# Patient Record
Sex: Female | Born: 1975 | Race: Black or African American | Hispanic: Yes | Marital: Married | Smoking: Former smoker
Health system: Southern US, Community
[De-identification: ages and names within clinical notes are randomized; demographics above are authoritative.]

## PROBLEM LIST (undated history)

## (undated) DIAGNOSIS — R519 Headache, unspecified: Secondary | ICD-10-CM

## (undated) DIAGNOSIS — Z8619 Personal history of other infectious and parasitic diseases: Secondary | ICD-10-CM

## (undated) HISTORY — DX: Personal history of other infectious and parasitic diseases: Z86.19

---
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
## 2003-01-23 HISTORY — PX: CYST REMOVAL HAND: SHX6279

## 2005-05-14 ENCOUNTER — Emergency Department (HOSPITAL_COMMUNITY): Admission: EM | Admit: 2005-05-14 | Discharge: 2005-05-14 | Payer: Self-pay | Admitting: Emergency Medicine

## 2005-06-15 ENCOUNTER — Emergency Department (HOSPITAL_COMMUNITY): Admission: EM | Admit: 2005-06-15 | Discharge: 2005-06-15 | Payer: Self-pay | Admitting: Family Medicine

## 2007-12-09 ENCOUNTER — Emergency Department (HOSPITAL_COMMUNITY): Admission: EM | Admit: 2007-12-09 | Discharge: 2007-12-09 | Payer: Self-pay | Admitting: Family Medicine

## 2008-02-04 ENCOUNTER — Emergency Department (HOSPITAL_COMMUNITY): Admission: EM | Admit: 2008-02-04 | Discharge: 2008-02-04 | Payer: Self-pay | Admitting: Family Medicine

## 2008-06-23 ENCOUNTER — Emergency Department (HOSPITAL_COMMUNITY): Admission: EM | Admit: 2008-06-23 | Discharge: 2008-06-23 | Payer: Self-pay | Admitting: Family Medicine

## 2008-09-17 ENCOUNTER — Emergency Department (HOSPITAL_COMMUNITY): Admission: EM | Admit: 2008-09-17 | Discharge: 2008-09-17 | Payer: Self-pay | Admitting: Emergency Medicine

## 2010-05-01 LAB — POCT PREGNANCY, URINE: Preg Test, Ur: NEGATIVE

## 2010-05-01 LAB — POCT URINALYSIS DIP (DEVICE)
Bilirubin Urine: NEGATIVE
Ketones, ur: NEGATIVE mg/dL
Nitrite: POSITIVE — AB
pH: 5 (ref 5.0–8.0)

## 2010-05-01 LAB — URINE CULTURE

## 2010-05-08 LAB — URINE CULTURE: Colony Count: >=100000

## 2010-05-08 LAB — POCT URINALYSIS DIP (DEVICE)
Bilirubin Urine: NEGATIVE
Specific Gravity, Urine: 1.025 (ref 1.005–1.030)
pH: 6 (ref 5.0–8.0)

## 2010-10-24 LAB — HIV ANTIBODY (ROUTINE TESTING W REFLEX): HIV: NONREACTIVE

## 2010-10-24 LAB — GC/CHLAMYDIA PROBE AMP, GENITAL: GC Probe Amp, Genital: NEGATIVE

## 2016-02-17 ENCOUNTER — Ambulatory Visit (INDEPENDENT_AMBULATORY_CARE_PROVIDER_SITE_OTHER): Payer: BLUE CROSS/BLUE SHIELD | Admitting: Nurse Practitioner

## 2016-02-17 ENCOUNTER — Encounter: Payer: Self-pay | Admitting: Nurse Practitioner

## 2016-02-17 VITALS — BP 108/70 | HR 60 | Temp 98.2°F | Resp 14 | Ht 64.0 in | Wt 163.0 lb

## 2016-02-17 DIAGNOSIS — Z0000 Encounter for general adult medical examination without abnormal findings: Secondary | ICD-10-CM | POA: Diagnosis not present

## 2016-02-17 DIAGNOSIS — K219 Gastro-esophageal reflux disease without esophagitis: Secondary | ICD-10-CM | POA: Diagnosis not present

## 2016-02-17 MED ORDER — CIMETIDINE 200 MG PO TABS: 200.0000 mg | ORAL_TABLET | Freq: Two times a day (BID) | ORAL | Status: DC

## 2016-02-17 NOTE — Progress Notes (Signed)
Subjective:    Patient ID: Danielle Cannon, female    DOB: 06/03/1975, 41 y.o.   MRN: [id number]  Patient presents today for complete physical or establish care (new patient)   HPI   Throat Clearing: Ongoing for several years, worst with certain foods and morning. Tried cimetidine.  Immunizations: (TDAP, Hep C screen, Pneumovax, Influenza, zoster)  Health Maintenance  Topic Date Due  . Pap Smear  05/01/1998  . Flu Shot  04/21/2016*  . Tetanus Vaccine  01/22/2018  . HIV Screening  Completed  *Topic was postponed. The date shown is not the original due date.   Diet:regular Weight:  Wt Readings from Last 3 Encounters:  02/17/16 163 lb (73.9 kg)   Exercise:walking and running daily Fall Risk:none No flowsheet data found. Home Safety:home with children and boyfried Depression/Suicide:denies No flowsheet data found. No flowsheet data found. Pap Smear (every 4326yrs for >21-29 without HPV, every 8839yrs for >30-13639yrs with HPV):needed, upcoming appt with GYN Vision:up to date Dental:up to date Sexual History (birth control, marital status, STD):single, sxually active, hetrosexual, unprotected  Medications and allergies reviewed with patient and updated if appropriate.  There are no active problems to display for this patient.   No current outpatient prescriptions on file prior to visit.   No current facility-administered medications on file prior to visit.     Past Medical History:  Diagnosis Date  . History of chicken pox 1990    Past Surgical History:  Procedure Laterality Date  . CYST REMOVAL HAND Right 2005    Social History   Social History  . Marital status: Single    Spouse name: N/A  . Number of children: 2  . Years of education: some college   Occupational History  . [employer]   Social History Main Topics  . Smoking status: Former Smoker    Types: Cigarettes    Quit date: 02/17/2004  . Smokeless tobacco: Never Used  . Alcohol use 4.2 oz/week    7  Glasses of wine per week  . Drug use: No  . Sexual activity: Yes    Partners: Male    Birth control/ protection: None     Comment: single, unprotected   Other Topics Concern  . None   Social History Narrative  . None    Family History  Problem Relation Age of Onset  . Drug abuse Mother   . Diabetes Mother   . Ovarian cancer Mother 2468  . Alcoholism Father   . Cancer Maternal Grandmother     lymphoma  . Alzheimer's disease Maternal Grandfather   . Cancer Paternal Grandfather     throat        Review of Systems  Constitutional: Negative for fever, malaise/fatigue and weight loss.  HENT: Negative for congestion, sinus pain and sore throat.   Eyes:       Negative for visual changes  Respiratory: Positive for cough. Negative for sputum production, shortness of breath and wheezing.   Cardiovascular: Negative for chest pain, palpitations and leg swelling.  Gastrointestinal: Positive for heartburn. Negative for blood in stool, constipation and diarrhea.  Genitourinary: Negative for dysuria, frequency and urgency.  Musculoskeletal: Negative for falls, joint pain and myalgias.  Skin: Negative for rash.  Neurological: Negative for dizziness, sensory change and headaches.  Endo/Heme/Allergies: Does not bruise/bleed easily.  Psychiatric/Behavioral: Negative for depression, substance abuse and suicidal ideas. The patient is not nervous/anxious.     Objective:   Vitals:   02/17/16 1116  BP:  108/70  Pulse: 60  Resp: 14  Temp: 98.2 F (36.8 C)    Body mass index is 27.98 kg/m.   Physical Examination:  Physical Exam  Constitutional: She is oriented to person, place, and time and well-developed, well-nourished, and in no distress. No distress.  HENT:  Right Ear: External ear normal.  Left Ear: External ear normal.  Nose: Nose normal.  Mouth/Throat: Oropharynx is clear and moist. No oropharyngeal exudate.  Eyes: Conjunctivae and EOM are normal. Pupils are equal, round,  and reactive to light. No scleral icterus.  Neck: Normal range of motion. Neck supple. No thyromegaly present.  Cardiovascular: Normal rate, normal heart sounds and intact distal pulses.   Pulmonary/Chest: Effort normal and breath sounds normal. She exhibits no tenderness.  Abdominal: Soft. Bowel sounds are normal. She exhibits no distension. There is no tenderness.  Musculoskeletal: Normal range of motion. She exhibits no edema or tenderness.  Lymphadenopathy:    She has no cervical adenopathy.  Neurological: She is alert and oriented to person, place, and time. Gait normal.  Skin: Skin is warm and dry.  Psychiatric: Affect and judgment normal.    ASSESSMENT and PLAN:  Danielle Cannon was seen today for establish care.  Diagnoses and all orders for this visit:  Preventative health care -     TSH; Future -     CBC w/Diff; Future -     Lipid panel; Future -     Comprehensive metabolic panel; Future  Gastroesophageal reflux disease, esophagitis presence not specified -     cimetidine (TAGAMET) 200 MG tablet; Take 1 tablet (200 mg total) by mouth 2 (two) times daily.   No problem-specific Assessment & Plan notes found for this encounter.     Follow up: Return if symptoms worsen or fail to improve.  Danielle Penna, NP

## 2016-02-17 NOTE — Patient Instructions (Addendum)
Return to lab fasting at least 6-8hrs prior to blood draw. You will be called with results.  Have GYN office fax PAP smear results when done.  Food Choices for Gastroesophageal Reflux Disease, Adult When you have gastroesophageal reflux disease (GERD), the foods you eat and your eating habits are very important. Choosing the right foods can help ease the discomfort of GERD. What general guidelines do I need to follow?  Choose fruits, vegetables, whole grains, low-fat dairy products, and low-fat meat, fish, and poultry.  Limit fats such as oils, salad dressings, butter, nuts, and avocado.  Keep a food diary to identify foods that cause symptoms.  Avoid foods that cause reflux. These may be different for different people.  Eat frequent small meals instead of three large meals each day.  Eat your meals slowly, in a relaxed setting.  Limit fried foods.  Cook foods using methods other than frying.  Avoid drinking alcohol.  Avoid drinking large amounts of liquids with your meals.  Avoid bending over or lying down until 2-3 hours after eating. What foods are not recommended? The following are some foods and drinks that may worsen your symptoms: Vegetables  Tomatoes. Tomato juice. Tomato and spaghetti sauce. Chili peppers. Onion and garlic. Horseradish. Fruits  Oranges, grapefruit, and lemon (fruit and juice). Meats  High-fat meats, fish, and poultry. This includes hot dogs, ribs, ham, sausage, salami, and bacon. Dairy  Whole milk and chocolate milk. Sour cream. Cream. Butter. Ice cream. Cream cheese. Beverages  Coffee and tea, with or without caffeine. Carbonated beverages or energy drinks. Condiments  Hot sauce. Barbecue sauce. Sweets/Desserts  Chocolate and cocoa. Donuts. Peppermint and spearmint. Fats and Oils  High-fat foods, including [place] fries and potato chips. Other  Vinegar. Strong spices, such as black pepper, white pepper, red pepper, cayenne, curry powder,  cloves, ginger, and chili powder. The items listed above may not be a complete list of foods and beverages to avoid. Contact your dietitian for more information.  This information is not intended to replace advice given to you by your health care provider. Make sure you discuss any questions you have with your health care provider. Document Released: 01/08/2005 Document Revised: 06/16/2015 Document Reviewed: 11/12/2012 Elsevier Interactive Patient Education  2017 [employer]

## 2016-02-17 NOTE — Progress Notes (Signed)
Pre visit review using our clinic review tool, if applicable. No additional management support is needed unless otherwise documented below in the visit note. 

## 2016-02-20 ENCOUNTER — Other Ambulatory Visit (INDEPENDENT_AMBULATORY_CARE_PROVIDER_SITE_OTHER): Payer: BLUE CROSS/BLUE SHIELD

## 2016-02-20 DIAGNOSIS — Z0000 Encounter for general adult medical examination without abnormal findings: Secondary | ICD-10-CM | POA: Diagnosis not present

## 2016-02-20 LAB — CBC WITH DIFFERENTIAL/PLATELET
Basophils Absolute: 0 10*3/uL (ref 0.0–0.1)
Basophils Relative: 0.1 % (ref 0.0–3.0)
EOS PCT: 1 % (ref 0.0–5.0)
Eosinophils Absolute: 0.1 10*3/uL (ref 0.0–0.7)
HEMATOCRIT: 38.5 % (ref 36.0–46.0)
HEMOGLOBIN: 12.7 g/dL (ref 12.0–15.0)
LYMPHS ABS: 3 10*3/uL (ref 0.7–4.0)
LYMPHS PCT: 30.9 % (ref 12.0–46.0)
MCHC: 33.1 g/dL (ref 30.0–36.0)
MCV: 88.8 fl (ref 78.0–100.0)
MONOS PCT: 6.1 % (ref 3.0–12.0)
Monocytes Absolute: 0.6 10*3/uL (ref 0.1–1.0)
NEUTROS PCT: 61.9 % (ref 43.0–77.0)
Neutro Abs: 5.9 10*3/uL (ref 1.4–7.7)
Platelets: 337 10*3/uL (ref 150.0–400.0)
RBC: 4.34 Mil/uL (ref 3.87–5.11)
RDW: 14.2 % (ref 11.5–15.5)
WBC: 9.6 10*3/uL (ref 4.0–10.5)

## 2016-02-20 LAB — COMPREHENSIVE METABOLIC PANEL
ALT: 22 U/L (ref 0–35)
AST: 19 U/L (ref 0–37)
Albumin: 4.2 g/dL (ref 3.5–5.2)
Alkaline Phosphatase: 67 U/L (ref 39–117)
BUN: 15 mg/dL (ref 6–23)
CHLORIDE: 103 meq/L (ref 96–112)
CO2: 25 meq/L (ref 19–32)
Calcium: 9.1 mg/dL (ref 8.4–10.5)
Creatinine, Ser: 0.89 mg/dL (ref 0.40–1.20)
GFR: 75.12 mL/min (ref >60.00)
GLUCOSE: 99 mg/dL (ref 70–99)
POTASSIUM: 4.3 meq/L (ref 3.5–5.1)
SODIUM: 136 meq/L (ref 135–145)
Total Bilirubin: 0.4 mg/dL (ref 0.2–1.2)
Total Protein: 6.9 g/dL (ref 6.0–8.3)

## 2016-02-20 LAB — LIPID PANEL
CHOL/HDL RATIO: 3
Cholesterol: 161 mg/dL (ref 0–200)
HDL: 46.2 mg/dL (ref >39.00)
LDL CALC: 100 mg/dL — AB (ref 0–99)
NONHDL: 114.71
Triglycerides: 73 mg/dL (ref 0.0–149.0)
VLDL: 14.6 mg/dL (ref 0.0–40.0)

## 2016-02-20 LAB — TSH: TSH: 1.37 u[IU]/mL (ref 0.35–4.50)

## 2016-04-30 ENCOUNTER — Encounter (HOSPITAL_COMMUNITY): Payer: Self-pay | Admitting: *Deleted

## 2016-04-30 ENCOUNTER — Emergency Department (HOSPITAL_COMMUNITY)
Admission: EM | Admit: 2016-04-30 | Discharge: 2016-04-30 | Disposition: A | Discharge status: Home / Self Care | Payer: BLUE CROSS/BLUE SHIELD | Attending: Emergency Medicine | Admitting: Emergency Medicine

## 2016-04-30 DIAGNOSIS — Z87891 Personal history of nicotine dependence: Secondary | ICD-10-CM | POA: Insufficient documentation

## 2016-04-30 DIAGNOSIS — L309 Dermatitis, unspecified: Secondary | ICD-10-CM | POA: Diagnosis not present

## 2016-04-30 DIAGNOSIS — R21 Rash and other nonspecific skin eruption: Secondary | ICD-10-CM | POA: Diagnosis present

## 2016-04-30 LAB — RAPID STREP SCREEN (MED CTR MEBANE ONLY): Streptococcus, Group A Screen (Direct): NEGATIVE

## 2016-04-30 MED ORDER — PREDNISONE 10 MG PO TABS: ORAL_TABLET | ORAL | 0 refills | Status: DC

## 2016-04-30 NOTE — ED Triage Notes (Signed)
PT states on Friday started having funny sensation on hands and thinks she has eczema.  Rash.

## 2016-04-30 NOTE — ED Notes (Signed)
Pt states itchy red fine rash that started on wrist and has spread over her body to back neck and trunk , has NOT  used any benadryl but has used a friends cream that  She used on her baby

## 2016-04-30 NOTE — ED Provider Notes (Signed)
[facility]-EMERGENCY DEPT Provider Note   CSN: [id number] Arrival date & time: 04/30/16  6213  By signing my name below, I, [name], attest that this documentation has been prepared under the direction and in the presence of non-physician practitioner, Ok Edwards, PA-C. Electronically Signed: [name], Scribe. 04/30/2016. 10:24 AM.  History   Chief Complaint Chief Complaint  Patient presents with  . Rash   The history is provided by the patient. No language interpreter was used.   HPI Comments: Danielle Cannon is a 41 y.o. female who presents to the Emergency Department complaining of a gradually worsening, generalized pruritic rash that began ~3 days ago. Pt reports hx of similar symptoms "years ago" on her bilateral hands that "turned into eczema." She states she has been using topical cream that her friend gave her with no relief. She notes she experienced associated nausea, vomiting, and subjective fever 2 days ago that has now resolved; however, her daughter at home is still experiencing similar symptoms. Pt denies any sore throat.    Past Medical History:  Diagnosis Date  . History of chicken pox 1990   There are no active problems to display for this patient.  Past Surgical History:  Procedure Laterality Date  . CYST REMOVAL HAND Right 2005    OB History    No data available     Home Medications    Prior to Admission medications   Medication Sig Start Date End Date Taking? Authorizing Provider  cimetidine (TAGAMET) 200 MG tablet Take 1 tablet (200 mg total) by mouth 2 (two) times daily. 02/17/16   Anne Ng, NP  glucosamine-chondroitin 500-400 MG tablet Take 1 tablet by mouth 3 (three) times daily.    Historical Provider, MD  Multiple Vitamin (MULTIVITAMIN) tablet Take 1 tablet by mouth daily.    Historical Provider, MD    Family History Family History  Problem Relation Age of Onset  . Drug abuse Mother   . Diabetes Mother   . Ovarian cancer Mother 31  .  Alcoholism Father   . Cancer Maternal Grandmother     lymphoma  . Alzheimer's disease Maternal Grandfather   . Cancer Paternal Grandfather     throat    Social History Social History  Substance Use Topics  . Smoking status: Former Smoker    Types: Cigarettes    Quit date: 02/17/2004  . Smokeless tobacco: Never Used  . Alcohol use 4.2 oz/week    7 Glasses of wine per week     Comment: daily   Allergies   Patient has no known allergies.  Review of Systems Review of Systems  Constitutional: Positive for fever (resolved).  HENT: Negative for sore throat.   Gastrointestinal: Positive for nausea (resolved) and vomiting (resolved).  Skin: Positive for rash.   Physical Exam Updated Vital Signs BP 113/73 (BP Location: Left Arm)   Pulse 66   Temp 97.9 F (36.6 C)   Resp 16   LMP 04/15/2016 (Exact Date)   SpO2 100%   Physical Exam  Constitutional: She is oriented to person, place, and time. She appears well-developed and well-nourished.  HENT:  Head: Normocephalic.  Eyes: EOM are normal.  Neck: Normal range of motion.  Pulmonary/Chest: Effort normal.  Abdominal: She exhibits no distension.  Musculoskeletal: Normal range of motion.  Neurological: She is alert and oriented to person, place, and time.  Skin: Rash noted.  Fine, raised, mildly erythematous rash to her full body.  Psychiatric: She has a normal mood  and affect.  Nursing note and vitals reviewed.  ED Treatments / Results  DIAGNOSTIC STUDIES:  Oxygen Saturation is 100% on RA, normal by my interpretation.    COORDINATION OF CARE:  10:12 AM Discussed treatment plan with pt at bedside and pt agreed to plan.  Labs (all labs ordered are listed, but only abnormal results are displayed) Labs Reviewed  RAPID STREP SCREEN (NOT AT  [facility])  CULTURE, GROUP A STREP [facility])   EKG  EKG Interpretation None      Radiology No results found.  Procedures Procedures (including critical care time)  Medications  Ordered in ED Medications - No data to display  Initial Impression / Assessment and Plan / ED Course  I have reviewed the triage vital signs and the nursing notes.  Pertinent labs & imaging results that were available during my care of the patient were reviewed by me and considered in my medical decision making (see chart for details).       Final Clinical Impressions(s) / ED Diagnoses   Final diagnoses:  Rash and nonspecific skin eruption  Eczema, unspecified type  Rash    New Prescriptions Discharge Medication List as of 04/30/2016 11:34 AM    START taking these medications   Details  predniSONE (DELTASONE) 10 MG tablet 6,5,4,3,2,1 taper, Print      An After Visit Summary was printed and given to the patient. Meds ordered this encounter  Medications  . predniSONE (DELTASONE) 10 MG tablet    Sig: 6,5,4,3,2,1 taper    Dispense:  21 tablet    Refill:  0    Order Specific Question:   Supervising Provider    Answer:   Eber Hong [[id number]]    I personally performed the services in this documentation, which was scribed in my presence.  The recorded information has been reviewed and considered.   Barnet Pall.    Lonia Skinner [place], PA-C 04/30/16 1549    Pricilla Loveless, MD 05/04/16 [phone]

## 2016-04-30 NOTE — Discharge Instructions (Signed)
See your Physician for recheck.  Benadryl for itching.

## 2016-05-02 LAB — CULTURE, GROUP A STREP (THRC)

## 2017-12-11 ENCOUNTER — Emergency Department (HOSPITAL_COMMUNITY): Payer: 59

## 2017-12-11 ENCOUNTER — Other Ambulatory Visit: Payer: Self-pay

## 2017-12-11 ENCOUNTER — Encounter (HOSPITAL_COMMUNITY): Payer: Self-pay | Admitting: Emergency Medicine

## 2017-12-11 ENCOUNTER — Emergency Department (HOSPITAL_COMMUNITY)
Admission: EM | Admit: 2017-12-11 | Discharge: 2017-12-11 | Disposition: A | Discharge status: Home / Self Care | Payer: 59 | Attending: Emergency Medicine | Admitting: Emergency Medicine

## 2017-12-11 DIAGNOSIS — Z79899 Other long term (current) drug therapy: Secondary | ICD-10-CM | POA: Diagnosis not present

## 2017-12-11 DIAGNOSIS — Z87891 Personal history of nicotine dependence: Secondary | ICD-10-CM | POA: Diagnosis not present

## 2017-12-11 DIAGNOSIS — M542 Cervicalgia: Secondary | ICD-10-CM | POA: Diagnosis present

## 2017-12-11 DIAGNOSIS — M62838 Other muscle spasm: Secondary | ICD-10-CM | POA: Diagnosis not present

## 2017-12-11 MED ORDER — KETOROLAC TROMETHAMINE 30 MG/ML IJ SOLN
30.0000 mg | Freq: Once | INTRAMUSCULAR | Status: AC
  Administered 2017-12-11: 30 mg via INTRAMUSCULAR
  Filled 2017-12-11: qty 1

## 2017-12-11 MED ORDER — METHOCARBAMOL 500 MG PO TABS: 500.0000 mg | ORAL_TABLET | Freq: Two times a day (BID) | ORAL | 0 refills | Status: AC

## 2017-12-11 MED ORDER — METHOCARBAMOL 500 MG PO TABS
500.0000 mg | ORAL_TABLET | Freq: Once | ORAL | Status: AC
  Administered 2017-12-11: 500 mg via ORAL
  Filled 2017-12-11: qty 1

## 2017-12-11 NOTE — ED Provider Notes (Signed)
[facility] EMERGENCY DEPARTMENT Provider Note   CSN: [id number] Arrival date & time: 12/11/17  1548     History   Chief Complaint Chief Complaint  Patient presents with  . Neck Pain    HPI Danielle Cannon is a 42 y.o. female.  42 y.o female with no PMH presents to the ED with a chief complaint of neck pain x 1 days. Patient reports her symptoms first began yesterday along with left shoulder pain, which she describes as tingling. She reports the pain is worse with movement and while laying down flat. She has tried ice, ibuprofen but states no relieve in symptoms. Patient also reports some chest pain which she describes its due to "clearing her throat with a cough". She denies any previous history of CAD, fever, photophobia, or shortness of breath.      Past Medical History:  Diagnosis Date  . History of chicken pox 1990    There are no active problems to display for this patient.   Past Surgical History:  Procedure Laterality Date  . CYST REMOVAL HAND Right 2005     OB History   None      Home Medications    Prior to Admission medications   Medication Sig Start Date End Date Taking? Authorizing Provider  cimetidine (TAGAMET) 200 MG tablet Take 1 tablet (200 mg total) by mouth 2 (two) times daily. 02/17/16   Nche, Bonna Gains, NP  glucosamine-chondroitin 500-400 MG tablet Take 1 tablet by mouth 3 (three) times daily.    [provider]  methocarbamol (ROBAXIN) 500 MG tablet Take 1 tablet (500 mg total) by mouth 2 (two) times daily for 7 days. 12/11/17 12/18/17  Claude Manges, PA-C  Multiple Vitamin (MULTIVITAMIN) tablet Take 1 tablet by mouth daily.    [provider]  predniSONE (DELTASONE) 10 MG tablet 6,5,4,3,2,1 taper 04/30/16   Elson Areas, PA-C    Family History Family History  Problem Relation Age of Onset  . Drug abuse Mother   . Diabetes Mother   . Ovarian cancer Mother 16  . Alcoholism Father   . Cancer Maternal  Grandmother        lymphoma  . Alzheimer's disease Maternal Grandfather   . Cancer Paternal Grandfather        throat    Social History Social History   Tobacco Use  . Smoking status: Former Smoker    Types: Cigarettes    Last attempt to quit: 02/17/2004    Years since quitting: 13.8  . Smokeless tobacco: Never Used  Substance Use Topics  . Alcohol use: Yes    Alcohol/week: 7.0 standard drinks    Types: 7 Glasses of wine per week    Comment: daily  . Drug use: No     Allergies   Patient has no known allergies.   Review of Systems Review of Systems  Constitutional: Negative for fever.  Eyes: Negative for photophobia.  Musculoskeletal: Positive for neck pain and neck stiffness.     Physical Exam Updated Vital Signs BP 115/80   Pulse 85   Temp 98.5 F (36.9 C) (Oral)   Resp 18   SpO2 99%   Physical Exam  Constitutional: She is oriented to person, place, and time. She appears well-developed and well-nourished. No distress.  HENT:  Head: Normocephalic and atraumatic.  Mouth/Throat: Oropharynx is clear and moist. No oropharyngeal exudate.  Eyes: Pupils are equal, round, and reactive to light.  Neck: Muscular tenderness present. No spinous  process tenderness present. No neck rigidity. Decreased range of motion present. No edema and no erythema present. No Brudzinski's sign and no Kernig's sign noted.  Cardiovascular: Regular rhythm and normal heart sounds.  Pulmonary/Chest: Effort normal and breath sounds normal. No respiratory distress.  Abdominal: Soft. Bowel sounds are normal. She exhibits no distension. There is no tenderness.  Musculoskeletal: She exhibits no tenderness or deformity.       Cervical back: She exhibits pain and spasm.       Back:       Right lower leg: She exhibits no edema.       Left lower leg: She exhibits no edema.  Neurological: She is alert and oriented to person, place, and time.  Skin: Skin is warm and dry.  Psychiatric: She has a  normal mood and affect.  Nursing note and vitals reviewed.    ED Treatments / Results  Labs (all labs ordered are listed, but only abnormal results are displayed) Labs Reviewed - No data to display  EKG None  Radiology Dg Cervical Spine 2-3 Views  Result Date: 12/11/2017 CLINICAL DATA:  42 year old female with cervical spine pain, left shoulder pain and lower back pain. No known injury. EXAM: CERVICAL SPINE - 2-3 VIEW COMPARISON:  None. FINDINGS: Reversal of the normal cervical lordosis. No evidence of acute fracture or malalignment. No significant focal degenerative change or bony lesion. The visualized upper lungs are clear. No focal soft tissue abnormality. IMPRESSION: Reversal of the normal cervical lordosis may be secondary to underlying muscle spasm. No evidence of fracture, focal degenerative disease or bony lesion. Electronically Signed   By: Malachy MoanHeath  McCullough M.D.   On: 12/11/2017 16:56    Procedures Procedures (including critical care time)  Medications Ordered in ED Medications  methocarbamol (ROBAXIN) tablet 500 mg (500 mg Oral Given 12/11/17 1625)     Initial Impression / Assessment and Plan / ED Course  I have reviewed the triage vital signs and the nursing notes.  Pertinent labs & imaging results that were available during my care of the patient were reviewed by me and considered in my medical decision making (see chart for details).   Presents with neck pain along with muscle spasms which began yesterday.  Patient has tried ibuprofen along with ice but states no relieving symptoms.  She has pain with movement of her neck but denies any neck rigidity or fevers.  Low suspicion for meningitis patient is afebrile denies any photophobia, headache.  DG c spine  Reversal of the normal cervical lordosis may be secondary to  underlying muscle spasm.  I have given patient Robaxin while in the ED.  We will also give her a shot of Toradol while she is in the ED.  Patient  understands and agrees with management.  Patient does not have a primary care physician to follow-up with but states she will look for 1 to follow-up with.  Precautions provided.    Final Clinical Impressions(s) / ED Diagnoses   Final diagnoses:  Muscle spasms of neck    ED Discharge Orders         Ordered    methocarbamol (ROBAXIN) 500 MG tablet  2 times daily     12/11/17 1701           Claude MangesSoto, Devonda Pequignot, PA-C 12/11/17 1731    Virgina NorfolkCuratolo, Adam, DO 12/11/17 [id number]

## 2017-12-11 NOTE — ED Notes (Signed)
Patient given discharge instructions and verbalized understanding.  Patient stable to discharge at this time.  Patient is alert and oriented to baseline.  No distressed noted at this time.  All belongings taken with the patient at discharge.   

## 2017-12-11 NOTE — Discharge Instructions (Addendum)
I have prescribed muscle relaxers for your pain, please do not drink or drive while taking this medications as they can make you drowsy.    Please follow-up with PCP in 1 week for reevaluation of your symptoms.   

## 2017-12-11 NOTE — ED Triage Notes (Signed)
Pt reports waking up yesterday with a stiff neck. Pt reports today she felt pain shooting up to her head, L shoulder and lower back. Pt 800 mg ibuprofen 2 hours ago with some relief. Pt also reports using ice with some relief.

## 2018-02-27 ENCOUNTER — Ambulatory Visit: Payer: 59 | Attending: Family Medicine | Admitting: Family Medicine

## 2018-02-27 ENCOUNTER — Encounter: Payer: Self-pay | Admitting: Family Medicine

## 2018-02-27 VITALS — BP 117/73 | HR 61 | Temp 98.5°F | Resp 18 | Ht 65.0 in | Wt 163.0 lb

## 2018-02-27 DIAGNOSIS — F5102 Adjustment insomnia: Secondary | ICD-10-CM

## 2018-02-27 DIAGNOSIS — Z862 Personal history of diseases of the blood and blood-forming organs and certain disorders involving the immune mechanism: Secondary | ICD-10-CM

## 2018-02-27 DIAGNOSIS — R5383 Other fatigue: Secondary | ICD-10-CM | POA: Diagnosis not present

## 2018-02-27 DIAGNOSIS — F4321 Adjustment disorder with depressed mood: Secondary | ICD-10-CM | POA: Diagnosis not present

## 2018-02-27 DIAGNOSIS — F4320 Adjustment disorder, unspecified: Secondary | ICD-10-CM

## 2018-02-27 MED ORDER — TRAZODONE HCL 50 MG PO TABS: 25.0000 mg | ORAL_TABLET | Freq: Every evening | ORAL | 3 refills | Status: DC | PRN

## 2018-02-27 NOTE — Patient Instructions (Signed)

## 2018-02-27 NOTE — Progress Notes (Signed)
Subjective:    Patient ID: Danielle Cannon, female    DOB: May 30, 1975, 43 y.o.   MRN: [id number]  HPI       43 yo female new to the practice.  Patient reports that she is having difficulty sleeping as well as some fatigue and sadness after the recent death of her mother.  Patient states that she was at the nursing home with her mother and family around Christmas time and then patient returned home as her mother was in a coma and unresponsive.  Patient states that she knew that her mother would pass away but she was still not prepared for her mother's passing.  Patient states that she has difficulty both falling asleep and staying asleep.  Patient states that she will fall asleep for a few hours and then wake up and not be able to fall back asleep.  Patient has tried melatonin and over-the-counter sleep aids without success.  Patient does not believe that she is depressed but she is having grief issues after her mother's passing.  Patient states that her mother was diagnosed in 2017 with ovarian cancer which returned in 2019.  Patient reports no significant personal medical history other than prior anemia.  Patient reports that she is divorced but currently in a committed relationship.  Patient reports she is a former smoker and quit 18 to 19 years ago.  Patient does drink wine about 3-4 times per week.  Patient reports that her only surgical history has been removal of a cyst from her left hand.  Patient has no known allergies to drugs, foods or insect stings.   Past Medical History:  Diagnosis Date  . History of chicken pox 1990   Past Surgical History:  Procedure Laterality Date  . CYST REMOVAL HAND Right 2005   Social History   Tobacco Use  . Smoking status: Former Smoker    Types: Cigarettes    Last attempt to quit: 02/17/2004    Years since quitting: 14.0  . Smokeless tobacco: Never Used  Substance Use Topics  . Alcohol use: Yes    Alcohol/week: 7.0 standard drinks    Types: 7 Glasses of  wine per week    Comment: daily  . Drug use: No   Family History  Problem Relation Age of Onset  . Drug abuse Mother   . Diabetes Mother   . Ovarian cancer Mother 71  . Cancer Mother   . Alcoholism Father   . Hypertension Father   . Cancer Maternal Grandmother        lymphoma  . Alzheimer's disease Maternal Grandfather   . Cancer Paternal Grandfather        throat      Review of Systems  Constitutional: Positive for fatigue. Negative for chills and fever.  HENT: Negative for sore throat and trouble swallowing.   Respiratory: Negative for cough and shortness of breath.   Cardiovascular: Negative for chest pain, palpitations and leg swelling.  Gastrointestinal: Negative for abdominal pain, constipation, diarrhea and nausea.  Endocrine: Negative for polydipsia, polyphagia and polyuria.  Genitourinary: Negative for dysuria, flank pain and frequency.  Musculoskeletal: Negative for arthralgias, back pain and gait problem.  Allergic/Immunologic: Negative for environmental allergies, food allergies and immunocompromised state.  Neurological: Negative for dizziness and headaches.  Hematological: Negative for adenopathy. Does not bruise/bleed easily.  Psychiatric/Behavioral: Positive for sleep disturbance. Negative for self-injury and suicidal ideas. The patient is nervous/anxious.        Objective:   Physical Exam  BP 117/73 (BP Location: Right Arm, Patient Position: Sitting, Cuff Size: Normal)   Pulse 61   Temp 98.5 F (36.9 C) (Oral)   Resp 18   Ht 5\' 5"  (1.651 m)   Wt 163 lb (73.9 kg)   LMP 02/09/2018   SpO2 99%   BMI 27.12 kg/m Nurse's notes and vital signs reviewed General-well-nourished, well-developed adult female in no acute distress Neck-supple, no lymphadenopathy, no palpable thyroid nodules, borderline thyroid size Cardiovascular-regular rate and regular rhythm Lungs-clear to auscultation bilaterally, breathing is non-labored Abdomen-soft, nontender Back-no CVA  tenderness Extremities-no edema Psych- normal mood and judgment though affect is slightly flattened versus patient being fatigued        Assessment & Plan:  1. Adjustment insomnia Patient with grief reaction and adjustment insomnia.  Prescription provided for trazodone 50 mg to take one half or 1 pill at bedtime as needed for sleep.  Sleep hygiene also discussed - traZODone (DESYREL) 50 MG tablet; Take 0.5-1 tablets (25-50 mg total) by mouth at bedtime as needed for sleep.  Dispense: 30 tablet; Refill: 3  2. Fatigue, unspecified type Patient with complaint of fatigue which is likely related to her poor sleep and grief reaction status post the recent death of her mother.  Patient will have TSH, CBC and BMP at today's visit to look for thyroid disorder, anemia as patient reports prior history of anemia and electrolyte abnormality which may be contributing to her fatigue.  Patient was provided with medication to help with sleep and hopefully fatigue will improve once patient has reestablished a normal sleep pattern. - TSH - CBC with Differential - Basic Metabolic Panel  3.  Grief reaction Patient encouraged to obtain counseling through local counseling service, church bereavement group or hospice.  Patient declined meeting social worker at today's visit.  An After Visit Summary was printed and given to the patient.  Return if symptoms worsen or fail to improve, for consider well exam.

## 2018-02-28 LAB — BASIC METABOLIC PANEL WITH GFR
BUN/Creatinine Ratio: 25 — ABNORMAL HIGH (ref 9–23)
BUN: 20 mg/dL (ref 6–24)
CO2: 20 mmol/L (ref 20–29)
Calcium: 9.9 mg/dL (ref 8.7–10.2)
Chloride: 102 mmol/L (ref 96–106)
Creatinine, Ser: 0.8 mg/dL (ref 0.57–1.00)
GFR calc Af Amer: 107 mL/min/1.73
GFR calc non Af Amer: 93 mL/min/1.73
Glucose: 77 mg/dL (ref 65–99)
Potassium: 4.7 mmol/L (ref 3.5–5.2)
Sodium: 137 mmol/L (ref 134–144)

## 2018-02-28 LAB — CBC WITH DIFFERENTIAL/PLATELET
Basophils Absolute: 0 x10E3/uL (ref 0.0–0.2)
Basos: 0 %
EOS (ABSOLUTE): 0.1 x10E3/uL (ref 0.0–0.4)
Eos: 1 %
Hematocrit: 38.8 % (ref 34.0–46.6)
Hemoglobin: 12.9 g/dL (ref 11.1–15.9)
Immature Grans (Abs): 0 x10E3/uL (ref 0.0–0.1)
Immature Granulocytes: 0 %
Lymphocytes Absolute: 3.3 x10E3/uL — ABNORMAL HIGH (ref 0.7–3.1)
Lymphs: 29 %
MCH: 29.3 pg (ref 26.6–33.0)
MCHC: 33.2 g/dL (ref 31.5–35.7)
MCV: 88 fL (ref 79–97)
Monocytes Absolute: 0.7 x10E3/uL (ref 0.1–0.9)
Monocytes: 6 %
Neutrophils Absolute: 7.2 x10E3/uL — ABNORMAL HIGH (ref 1.4–7.0)
Neutrophils: 64 %
Platelets: 358 x10E3/uL (ref 150–450)
RBC: 4.4 x10E6/uL (ref 3.77–5.28)
RDW: 13.1 % (ref 11.7–15.4)
WBC: 11.4 x10E3/uL — ABNORMAL HIGH (ref 3.4–10.8)

## 2018-02-28 LAB — TSH: TSH: 1 u[IU]/mL (ref 0.450–4.500)

## 2018-03-01 DIAGNOSIS — Z862 Personal history of diseases of the blood and blood-forming organs and certain disorders involving the immune mechanism: Secondary | ICD-10-CM | POA: Insufficient documentation

## 2018-03-03 ENCOUNTER — Telehealth: Payer: Self-pay | Admitting: *Deleted

## 2018-03-03 NOTE — Telephone Encounter (Signed)
Patient verified DOB Patient is aware of labs being normal and needing to monitor any cold symptoms being that her WBC was elevated slightly. Patient states daughter is sick and she will mind her own symptoms but denies feeling bad. No further questions at this time

## 2018-03-03 NOTE — Telephone Encounter (Signed)
-----   Message from Cain Saupe, MD sent at 03/01/2018  8:25 PM EST ----- Notify patient of normal BMP, normal TSH and a slight increase in wbc at 11.4 (normal 3.4-10.8)

## 2018-03-05 ENCOUNTER — Other Ambulatory Visit: Payer: Self-pay | Admitting: Family Medicine

## 2018-03-05 ENCOUNTER — Telehealth: Payer: Self-pay | Admitting: Nurse Practitioner

## 2018-03-05 DIAGNOSIS — Z1239 Encounter for other screening for malignant neoplasm of breast: Secondary | ICD-10-CM

## 2018-03-05 NOTE — Telephone Encounter (Signed)
Patient wants a call back about a mammogram. There is no order for one.

## 2018-03-05 NOTE — Telephone Encounter (Signed)
Patient is requesting the order for mammogram be placed.

## 2018-03-05 NOTE — Telephone Encounter (Signed)
Order has been placed for mammogram at the [facility]

## 2018-03-05 NOTE — Progress Notes (Signed)
Patient ID: Danielle Cannon, female   DOB: October 24, 1975, 43 y.o.   MRN: [id number]   Patient called requesting mammogram order. Order placed for mammogram at the breast center. At last visit, I believe that patient was listed as uninsured and received information to apply for scholarship for free mammogram but is now listed as having coverage by [employer].

## 2018-03-13 ENCOUNTER — Other Ambulatory Visit: Payer: 59 | Admitting: Family Medicine

## 2018-03-20 ENCOUNTER — Ambulatory Visit
Admission: RE | Admit: 2018-03-20 | Discharge: 2018-03-20 | Disposition: A | Discharge status: Home / Self Care | Payer: 59 | Source: Ambulatory Visit | Attending: Family Medicine | Admitting: Family Medicine

## 2018-03-20 DIAGNOSIS — Z1239 Encounter for other screening for malignant neoplasm of breast: Secondary | ICD-10-CM

## 2018-03-21 ENCOUNTER — Other Ambulatory Visit: Payer: Self-pay | Admitting: Family Medicine

## 2018-03-21 DIAGNOSIS — R928 Other abnormal and inconclusive findings on diagnostic imaging of breast: Secondary | ICD-10-CM

## 2018-03-23 ENCOUNTER — Other Ambulatory Visit: Payer: Self-pay | Admitting: Family Medicine

## 2018-03-23 DIAGNOSIS — N6310 Unspecified lump in the right breast, unspecified quadrant: Secondary | ICD-10-CM

## 2018-03-23 DIAGNOSIS — R928 Other abnormal and inconclusive findings on diagnostic imaging of breast: Secondary | ICD-10-CM

## 2018-03-23 NOTE — Progress Notes (Signed)
Patient ID: Danielle Cannon, female   DOB: [birth year], 43 y.o.   MRN: [id number]   Patient with a possible mass in the right breast seen on recent mammogram and patient per radiology needs diagnostic mammogram and ultrasound of the right breast

## 2018-03-26 ENCOUNTER — Ambulatory Visit
Admission: RE | Admit: 2018-03-26 | Discharge: 2018-03-26 | Disposition: A | Discharge status: Home / Self Care | Payer: 59 | Source: Ambulatory Visit | Attending: Family Medicine | Admitting: Family Medicine

## 2018-03-26 DIAGNOSIS — R928 Other abnormal and inconclusive findings on diagnostic imaging of breast: Secondary | ICD-10-CM

## 2018-04-08 ENCOUNTER — Ambulatory Visit (HOSPITAL_COMMUNITY)
Admission: EM | Admit: 2018-04-08 | Discharge: 2018-04-08 | Disposition: A | Discharge status: Home / Self Care | Payer: 59 | Attending: Family Medicine | Admitting: Family Medicine

## 2018-04-08 ENCOUNTER — Other Ambulatory Visit: Payer: Self-pay

## 2018-04-08 ENCOUNTER — Encounter (HOSPITAL_COMMUNITY): Payer: Self-pay | Admitting: Emergency Medicine

## 2018-04-08 DIAGNOSIS — J019 Acute sinusitis, unspecified: Secondary | ICD-10-CM

## 2018-04-08 MED ORDER — AMOXICILLIN-POT CLAVULANATE 875-125 MG PO TABS: 1.0000 | ORAL_TABLET | Freq: Two times a day (BID) | ORAL | 0 refills | Status: AC

## 2018-04-08 NOTE — ED Triage Notes (Signed)
Pt here for nasal congestion and URI sx

## 2018-04-08 NOTE — ED Provider Notes (Signed)
[facility]    CSN: [id number] Arrival date & time: 04/08/18  1059     History   Chief Complaint Chief Complaint  Patient presents with  . Nasal Congestion    HPI Danielle Cannon is a 43 y.o. female no significant past medical history presenting today for evaluation of congestion and sore throat.  Patient states that she has had symptoms for approximately 1 to 2 weeks.  Of recently feel her symptoms have worsened.  She has had a lot of pressure in her face and eyes.  She is also started to have discomfort in her teeth as well as decreased taste.  She denies any cough, denies shortness of breath, denies fever.  She has had some mild body aches.  Patient notes that she did recently travel to [place].  She has been taking cetirizine/pseudoephedrine combo as well as Flonase with minimal relief.  HPI  Past Medical History:  Diagnosis Date  . History of chicken pox 1990    Patient Active Problem List   Diagnosis Date Noted  . History of anemia 03/01/2018    Past Surgical History:  Procedure Laterality Date  . CYST REMOVAL HAND Right 2005    OB History   No obstetric history on file.      Home Medications    Prior to Admission medications   Medication Sig Start Date End Date Taking? Authorizing Provider  amoxicillin-clavulanate (AUGMENTIN) 875-125 MG tablet Take 1 tablet by mouth every 12 (twelve) hours for 10 days. 04/08/18 04/18/18  Terriann Difonzo C, PA-C  glucosamine-chondroitin 500-400 MG tablet Take 1 tablet by mouth 3 (three) times daily.    [provider]  Multiple Vitamin (MULTIVITAMIN) tablet Take 1 tablet by mouth daily.    [provider]  traZODone (DESYREL) 50 MG tablet Take 0.5-1 tablets (25-50 mg total) by mouth at bedtime as needed for sleep. 02/27/18   Cain Saupe, MD    Family History Family History  Problem Relation Age of Onset  . Drug abuse Mother   . Diabetes Mother   . Ovarian cancer Mother 3  . Cancer Mother   .  Alcoholism Father   . Hypertension Father   . Cancer Maternal Grandmother        lymphoma  . Alzheimer's disease Maternal Grandfather   . Cancer Paternal Grandfather        throat    Social History Social History   Tobacco Use  . Smoking status: Former Smoker    Types: Cigarettes    Last attempt to quit: 02/17/2004    Years since quitting: 14.1  . Smokeless tobacco: Never Used  Substance Use Topics  . Alcohol use: Yes    Alcohol/week: 7.0 standard drinks    Types: 7 Glasses of wine per week    Comment: daily  . Drug use: No     Allergies   Patient has no known allergies.   Review of Systems Review of Systems  Constitutional: Negative for activity change, appetite change, chills, fatigue and fever.  HENT: Positive for congestion, rhinorrhea, sinus pressure and sore throat. Negative for ear pain and trouble swallowing.   Eyes: Negative for discharge and redness.  Respiratory: Negative for cough, chest tightness and shortness of breath.   Cardiovascular: Negative for chest pain.  Gastrointestinal: Negative for abdominal pain, diarrhea, nausea and vomiting.  Musculoskeletal: Negative for myalgias.  Skin: Negative for rash.  Neurological: Negative for dizziness, light-headedness and headaches.     Physical Exam Triage Vital  Signs ED Triage Vitals [04/08/18 1214]  Enc Vitals Group     BP 115/76     Pulse Rate 62     Resp 18     Temp 97.6 F (36.4 C)     Temp Source Temporal     SpO2 99 %     Weight      Height      Head Circumference      Peak Flow      Pain Score 3     Pain Loc      Pain Edu?      Excl. in GC?    No data found.  Updated Vital Signs BP 115/76 (BP Location: Right Arm)   Pulse 62   Temp 97.6 F (36.4 C) (Temporal)   Resp 18   SpO2 99%   Visual Acuity Right Eye Distance:   Left Eye Distance:   Bilateral Distance:    Right Eye Near:   Left Eye Near:    Bilateral Near:     Physical Exam Vitals signs and nursing note reviewed.   Constitutional:      General: She is not in acute distress.    Appearance: She is well-developed.  HENT:     Head: Normocephalic and atraumatic.     Ears:     Comments: Bilateral ears without tenderness to palpation of external auricle, tragus and mastoid, EAC's without erythema or swelling, TM's with good bony landmarks and cone of light. Non erythematous.     Nose:     Comments: Nasal mucosa erythematous, mildly swollen turbinates    Mouth/Throat:     Comments: Oral mucosa pink and moist, no tonsillar enlargement or exudate. Posterior pharynx patent and nonerythematous, no uvula deviation or swelling. Normal phonation.  Eyes:     Conjunctiva/sclera: Conjunctivae normal.  Neck:     Musculoskeletal: Neck supple.  Cardiovascular:     Rate and Rhythm: Normal rate and regular rhythm.     Heart sounds: No murmur.  Pulmonary:     Effort: Pulmonary effort is normal. No respiratory distress.     Breath sounds: Normal breath sounds.     Comments: Breathing comfortably at rest, CTABL, no wheezing, rales or other adventitious sounds auscultated Abdominal:     Palpations: Abdomen is soft.     Tenderness: There is no abdominal tenderness.  Skin:    General: Skin is warm and dry.  Neurological:     Mental Status: She is alert.      UC Treatments / Results  Labs (all labs ordered are listed, but only abnormal results are displayed) Labs Reviewed - No data to display  EKG None  Radiology No results found.  Procedures Procedures (including critical care time)  Medications Ordered in UC Medications - No data to display  Initial Impression / Assessment and Plan / UC Course  I have reviewed the triage vital signs and the nursing notes.  Pertinent labs & imaging results that were available during my care of the patient were reviewed by me and considered in my medical decision making (see chart for details).     We will treat patient for sinusitis, will begin on Augmentin,  continue symptomatic management with cetirizine/Sudafed combo, push fluids.  Continue to monitor,Discussed strict return precautions. Patient verbalized understanding and is agreeable with plan.   Patient had recently traveled to [facility] which is an area with higher concentration of novel coronavirus, denies lower respiratory symptoms of fever, shortness of breath and  cough.  Will defer testing at this time and continue treatment for sinusitis. Final Clinical Impressions(s) / UC Diagnoses   Final diagnoses:  Acute sinusitis with symptoms > 10 days     Discharge Instructions     Begin Augmentin twice daily Continue cetirizine/sudafed combo medicine Push fluids Follow up if symptoms not resolving or worsening   ED Prescriptions    Medication Sig Dispense Auth. Provider   amoxicillin-clavulanate (AUGMENTIN) 875-125 MG tablet Take 1 tablet by mouth every 12 (twelve) hours for 10 days. 20 tablet Antionne Enrique, [place] C, PA-C     Controlled Substance Prescriptions [place] Controlled Substance Registry consulted? Not Applicable   Lew Dawes, [place] 04/08/18 1555

## 2018-04-08 NOTE — Discharge Instructions (Addendum)
Begin Augmentin twice daily Continue cetirizine/sudafed combo medicine Push fluids Follow up if symptoms not resolving or worsening

## 2018-04-21 ENCOUNTER — Other Ambulatory Visit: Payer: 59 | Admitting: Family Medicine

## 2018-05-16 ENCOUNTER — Other Ambulatory Visit: Payer: Self-pay

## 2018-05-16 ENCOUNTER — Encounter: Payer: Self-pay | Admitting: Family Medicine

## 2018-05-16 ENCOUNTER — Ambulatory Visit (HOSPITAL_BASED_OUTPATIENT_CLINIC_OR_DEPARTMENT_OTHER): Payer: 59 | Admitting: Family Medicine

## 2018-05-16 ENCOUNTER — Other Ambulatory Visit (HOSPITAL_COMMUNITY)
Admission: RE | Admit: 2018-05-16 | Discharge: 2018-05-16 | Disposition: A | Discharge status: Home / Self Care | Payer: 59 | Source: Ambulatory Visit | Attending: Family Medicine | Admitting: Family Medicine

## 2018-05-16 VITALS — BP 124/78 | HR 59 | Temp 98.8°F | Ht 65.0 in | Wt 157.4 lb

## 2018-05-16 DIAGNOSIS — N979 Female infertility, unspecified: Secondary | ICD-10-CM | POA: Diagnosis not present

## 2018-05-16 DIAGNOSIS — N898 Other specified noninflammatory disorders of vagina: Secondary | ICD-10-CM

## 2018-05-16 DIAGNOSIS — Z124 Encounter for screening for malignant neoplasm of cervix: Secondary | ICD-10-CM | POA: Diagnosis not present

## 2018-05-16 DIAGNOSIS — N841 Polyp of cervix uteri: Secondary | ICD-10-CM | POA: Diagnosis not present

## 2018-05-16 NOTE — Progress Notes (Signed)
Per pt she is here to get second opinion,   She said her mother passed away this January 202 with Ovarian Cancer  Per pt she went to this OBGYN and was told that she had some cyst and they was normal and common and this was 2.5 years ago.   Per patient she had a benign cyst under her right breast but she's been feeling pain.  Per patient sometimes it's painful during intercourse.   Went to [place] for family reasons in March

## 2018-05-16 NOTE — Patient Instructions (Signed)
Preventive Care 40-64 Years, Female Preventive care refers to lifestyle choices and visits with your health care provider that can promote health and wellness. What does preventive care include?   A yearly physical exam. This is also called an annual well check.  Dental exams once or twice a year.  Routine eye exams. Ask your health care provider how often you should have your eyes checked.  Personal lifestyle choices, including: ? Daily care of your teeth and gums. ? Regular physical activity. ? Eating a healthy diet. ? Avoiding tobacco and drug use. ? Limiting alcohol use. ? Practicing safe sex. ? Taking low-dose aspirin daily starting at age 50. ? Taking vitamin and mineral supplements as recommended by your health care provider. What happens during an annual well check? The services and screenings done by your health care provider during your annual well check will depend on your age, overall health, lifestyle risk factors, and family history of disease. Counseling Your health care provider may ask you questions about your:  Alcohol use.  Tobacco use.  Drug use.  Emotional well-being.  Home and relationship well-being.  Sexual activity.  Eating habits.  Work and work environment.  Method of birth control.  Menstrual cycle.  Pregnancy history. Screening You may have the following tests or measurements:  Height, weight, and BMI.  Blood pressure.  Lipid and cholesterol levels. These may be checked every 5 years, or more frequently if you are over 50 years old.  Skin check.  Lung cancer screening. You may have this screening every year starting at age 55 if you have a 30-pack-year history of smoking and currently smoke or have quit within the past 15 years.  Colorectal cancer screening. All adults should have this screening starting at age 50 and continuing until age 75. Your health care provider may recommend screening at age 45. You will have tests every  1-10 years, depending on your results and the type of screening test. People at increased risk should start screening at an earlier age. Screening tests may include: ? Guaiac-based fecal occult blood testing. ? Fecal immunochemical test (FIT). ? Stool DNA test. ? Virtual colonoscopy. ? Sigmoidoscopy. During this test, a flexible tube with a tiny camera (sigmoidoscope) is used to examine your rectum and lower colon. The sigmoidoscope is inserted through your anus into your rectum and lower colon. ? Colonoscopy. During this test, a long, thin, flexible tube with a tiny camera (colonoscope) is used to examine your entire colon and rectum.  Hepatitis C blood test.  Hepatitis B blood test.  Sexually transmitted disease (STD) testing.  Diabetes screening. This is done by checking your blood sugar (glucose) after you have not eaten for a while (fasting). You may have this done every 1-3 years.  Mammogram. This may be done every 1-2 years. Talk to your health care provider about when you should start having regular mammograms. This may depend on whether you have a family history of breast cancer.  BRCA-related cancer screening. This may be done if you have a family history of breast, ovarian, tubal, or peritoneal cancers.  Pelvic exam and Pap test. This may be done every 3 years starting at age 21. Starting at age 30, this may be done every 5 years if you have a Pap test in combination with an HPV test.  Bone density scan. This is done to screen for osteoporosis. You may have this scan if you are at high risk for osteoporosis. Discuss your test results, treatment options,   and if necessary, the need for more tests with your health care provider. Vaccines Your health care provider may recommend certain vaccines, such as:  Influenza vaccine. This is recommended every year.  Tetanus, diphtheria, and acellular pertussis (Tdap, Td) vaccine. You may need a Td booster every 10 years.  Varicella  vaccine. You may need this if you have not been vaccinated.  Zoster vaccine. You may need this after age 53.  Measles, mumps, and rubella (MMR) vaccine. You may need at least one dose of MMR if you were born in 1957 or later. You may also need a second dose.  Pneumococcal 13-valent conjugate (PCV13) vaccine. You may need this if you have certain conditions and were not previously vaccinated.  Pneumococcal polysaccharide (PPSV23) vaccine. You may need one or two doses if you smoke cigarettes or if you have certain conditions.  Meningococcal vaccine. You may need this if you have certain conditions.  Hepatitis A vaccine. You may need this if you have certain conditions or if you travel or work in places where you may be exposed to hepatitis A.  Hepatitis B vaccine. You may need this if you have certain conditions or if you travel or work in places where you may be exposed to hepatitis B.  Haemophilus influenzae type b (Hib) vaccine. You may need this if you have certain conditions. Talk to your health care provider about which screenings and vaccines you need and how often you need them. This information is not intended to replace advice given to you by your health care provider. Make sure you discuss any questions you have with your health care provider. Document Released: 02/04/2015 Document Revised: 02/28/2017 Document Reviewed: 11/09/2014 Elsevier Interactive Patient Education  2019 [employer].  Female Infertility  Female infertility refers to a woman's inability to get pregnant (conceive) after a year of having sex regularly (or after 6 months in women over age 108) without using birth control. Infertility can also mean that a woman is not able to carry a pregnancy to full term. Both women and men can have fertility problems. What are the causes? This condition may be caused by:  Problems with reproductive organs. Infertility can result if a woman: ? Has an abnormally short cervix  or a cervix that does not remain closed during a pregnancy. ? Has a blockage or scarring in the fallopian tubes. ? Has an abnormally shaped uterus. ? Has uterine fibroids. This is a benign mass of tissue or muscle (tumor) that can develop in the uterus. ? Is not ovulating in a regular way.  Certain medical conditions. These may include: ? Polycystic ovary syndrome (PCOS). This is a hormonal disorder that can cause small cysts to grow on the ovaries. This is the most common cause of infertility in women. ? Endometriosis. This is a condition in which the tissue that lines the uterus (endometrium) grows outside of its normal location. ? Cancer and cancer treatments, such as chemotherapy or radiation. ? Premature ovarian failure. This is when ovaries stop producing eggs and hormones before age 1. ? Sexually transmitted diseases, such as chlamydia or gonorrhea. ? Autoimmune disorders. These are disorders in which the body's defense system (immune system) attacks normal, healthy cells. Infertility can be linked to more than one cause. For some women, the cause of infertility is not known (unexplained infertility). What increases the risk?  Age. A woman's fertility declines with age, especially after her mid-32s.  Being underweight or overweight.  Drinking too much alcohol.  Using drugs such as anabolic steroids, cocaine, and marijuana.  Exercising excessively.  Being exposed to environmental toxins, such as radiation, pesticides, and certain chemicals. What are the signs or symptoms? The main sign of infertility in women is the inability to get pregnant or carry a pregnancy to full term. How is this diagnosed? This condition may be diagnosed by:  Checking whether you are ovulating each month. The tests may include: ? Blood tests to check hormone levels. ? An ultrasound of the ovaries. ? Taking a small tissue that lines the uterus and checking it under a microscope (endometrial  biopsy).  Doing additional tests. This is done if ovulation is normal. Tests may include: ? Hysterosalpingography. This X-ray test can show the shape of the uterus and whether the fallopian tubes are open. ? Laparoscopy. This test uses a lighted tube (laparoscope) to look for problems in the fallopian tubes and other organs. ? Transvaginal ultrasound. This imaging test is used to check for abnormalities in the uterus and ovaries. ? Hysteroscopy. This test uses a lighted tube to check for problems in the cervix and the uterus. To be diagnosed with infertility, both partners will have a physical exam. Both partners will also have an extensive medical and sexual history taken. Additional tests may be done. How is this treated? Treatment depends on the cause of infertility. Most cases of infertility in women are treated with medicine or surgery.  Women may take medicine to: ? Correct ovulation problems. ? Treat other health conditions.  Surgery may be done to: ? Repair damage to the ovaries, fallopian tubes, cervix, or uterus. ? Remove growths from the uterus. ? Remove scar tissue from the uterus, pelvis, or other organs. Assisted reproductive technology (ART) Assisted reproductive technology (ART) refers to all treatments and procedures that combine eggs and sperm outside the body to try to help a couple conceive. ART is often combined with fertility drugs to stimulate ovulation. Sometimes ART is done using eggs retrieved from another woman's body (donor eggs) or from previously frozen fertilized eggs (embryos). There are different types of ART. These include:  Intrauterine insemination (IUI). A long, thin tube is used to place sperm directly into a woman's uterus. This procedure: ? Is effective for infertility caused by sperm problems, including low sperm count and low motility. ? Can be used in combination with fertility drugs.  In vitro fertilization (IVF). This is done when a woman's  fallopian tubes are blocked or when a man has low sperm count. In this procedure: ? Fertility drugs are used to stimulate the ovaries to produce multiple eggs. ? Once mature, these eggs are removed from the body and combined with the sperm to be fertilized. ? The fertilized eggs are then placed into the woman's uterus. Follow these instructions at home:  Take over-the-counter and prescription medicines only as told by your health care provider.  Do not use any products that contain nicotine or tobacco, such as cigarettes and e-cigarettes. If you need help quitting, ask your health care provider.  If you drink alcohol, limit how much you have to 1 drink a day.  Make dietary changes to lose weight or maintain a healthy weight. Work with your health care provider and a dietitian to set a weight-loss goal that is healthy and reasonable for you.  Seek support from a counselor or support group to talk about your concerns related to infertility. Couples counseling may be helpful for you and your partner.  Practice stress reduction techniques that  work well for you, such as regular physical activity, meditation, or deep breathing.  Keep all follow-up visits as told by your health care provider. This is important. Contact a health care provider if you:  Feel that stress is interfering with your life and relationships.  Have side effects from treatments for infertility. Summary  Female infertility refers to a woman's inability to get pregnant (conceive) after a year of having sex regularly (or after 6 months in women over age 37) without using birth control.  To be diagnosed with infertility, both partners will have a physical exam. Both partners will also have an extensive medical and sexual history taken.  Seek support from a counselor or support group to talk about your concerns related to infertility. Couples counseling may be helpful for you and your partner. This information is not  intended to replace advice given to you by your health care provider. Make sure you discuss any questions you have with your health care provider. Document Released: 01/11/2003 Document Revised: 12/10/2016 Document Reviewed: 12/10/2016 Elsevier Interactive Patient Education  2019 [employer].

## 2018-05-16 NOTE — Progress Notes (Signed)
Established Patient Office Visit  Subjective:  Patient ID: Danielle Cannon, female    DOB: 09/06/1975  Age: 43 y.o. MRN: [id number]  CC:  Chief Complaint  Patient presents with  . Pap Only  . Breast Pain  . intercourse pain    HPI Danielle Cannon presents for screening for cervical cancer/pap smear. She reports that her periods are regular and she is not on any form of birth control. Patient is concerned about infertility because in the past she has been able to get pregnant easily but despite unprotected sexual intercourse she has not gotten pregnant. She also also started having pain during sexual intercourse and crampy lower abdominal pain in the middle of her cycle and has started using a phone app which suggests that her pain is greatest during ovulation. She does not recall any abnormal mammograms in the past.       She did have a mammogram and had to return for additional views as there was an abnormality seen in the right breast which turned out to be a benign cyst but she continues to have pain on that side/area of her right breast. She denies caffeine intake.   Past Medical History:  Diagnosis Date  . History of chicken pox 1990    Past Surgical History:  Procedure Laterality Date  . CYST REMOVAL HAND Right 2005    Family History  Problem Relation Age of Onset  . Drug abuse Mother   . Diabetes Mother   . Ovarian cancer Mother [id number]  . Cancer Mother   . Alcoholism Father   . Hypertension Father   . Cancer Maternal Grandmother        lymphoma  . Alzheimer's disease Maternal Grandfather   . Cancer Paternal Grandfather        throat   Social History   Tobacco Use  . Smoking status: Former Smoker    Types: Cigarettes    Last attempt to quit: 02/17/2004    Years since quitting: 14.2  . Smokeless tobacco: Never Used  Substance Use Topics  . Alcohol use: Yes    Alcohol/week: 7.0 standard drinks    Types: 7 Glasses of wine per week    Comment: daily  . Drug use: No      Outpatient Medications Prior to Visit  Medication Sig Dispense Refill  . Ascorbic Acid (VITAMIN C PO) Take by mouth.    . ELDERBERRY PO Take by mouth.    . FOLIC ACID PO Take by mouth.    . Ginger, Zingiber officinalis, (GINGER PO) Take by mouth.    Marland Kitchen. glucosamine-chondroitin 500-400 MG tablet Take 1 tablet by mouth 3 (three) times daily.    . traZODone (DESYREL) 50 MG tablet Take 0.5-1 tablets (25-50 mg total) by mouth at bedtime as needed for sleep. 30 tablet 3  . VITAMIN E PO Take by mouth.    . Multiple Vitamin (MULTIVITAMIN) tablet Take 1 tablet by mouth daily.     No facility-administered medications prior to visit.     No Known Allergies  ROS Review of Systems  Constitutional: Negative for chills, fatigue and fever.  HENT: Negative for sore throat and trouble swallowing.   Eyes: Negative for photophobia and visual disturbance.  Respiratory: Negative for cough and shortness of breath.   Cardiovascular: Negative for chest pain, palpitations and leg swelling.  Gastrointestinal: Positive for abdominal pain (related to menstral cycle). Negative for blood in stool, constipation, diarrhea, nausea and vomiting.  Endocrine: Negative for polydipsia, polyphagia  and polyuria.  Genitourinary: Positive for dyspareunia and menstrual problem (pain during ovulation). Negative for dysuria, flank pain and frequency.  Musculoskeletal: Negative for back pain, gait problem and joint swelling.  Allergic/Immunologic: Negative for environmental allergies, food allergies and immunocompromised state.  Neurological: Negative for dizziness and headaches.  Hematological: Negative for adenopathy. Does not bruise/bleed easily.  Psychiatric/Behavioral: Negative for sleep disturbance. The patient is not nervous/anxious.       Objective:    Physical Exam  Constitutional: She is oriented to person, place, and time. She appears well-developed and well-nourished.  Cardiovascular: Normal rate and regular  rhythm.  Pulmonary/Chest: Effort normal and breath sounds normal.  Abdominal: Soft. There is no abdominal tenderness. There is no rebound and no guarding.  Genitourinary:    Uterus normal.     Vaginal discharge (white vaginal discharge in the vaginal canal, vault and on the surface of the  cervix) present.     Genitourinary Comments: Possible polyp versus irritation at the cervical os with some friability/bleeding; no CMT, no adnexal fullness or tenderness   Musculoskeletal:        General: No tenderness or edema.  Neurological: She is alert and oriented to person, place, and time.  Skin: Skin is warm and dry.  Psychiatric: She has a normal mood and affect. Her behavior is normal. Judgment and thought content normal.  Nursing note and vitals reviewed.   BP 124/78 (BP Location: Right Arm, Patient Position: Sitting, Cuff Size: Normal)   Pulse (!) 59   Temp 98.8 F (37.1 C) (Oral)   Ht 5\' 5"  (1.651 m)   Wt 157 lb 6.4 oz (71.4 kg)   LMP 04/30/2018 (Exact Date)   SpO2 97%   BMI 26.19 kg/m  Wt Readings from Last 3 Encounters:  05/16/18 157 lb 6.4 oz (71.4 kg)  02/27/18 163 lb (73.9 kg)  02/17/16 163 lb (73.9 kg)     Health Maintenance Due  Topic Date Due  . PAP SMEAR-Modifier  05/01/1998  . TETANUS/TDAP  01/22/2018   -Pap done at today's visit -Tdap offered but declined by patient    Lab Results  Component Value Date   TSH 1.000 02/27/2018   Lab Results  Component Value Date   WBC 11.4 (H) 02/27/2018   HGB 12.9 02/27/2018   HCT 38.8 02/27/2018   MCV 88 02/27/2018   PLT 358 02/27/2018   Lab Results  Component Value Date   NA 137 02/27/2018   K 4.7 02/27/2018   CO2 20 02/27/2018   GLUCOSE 77 02/27/2018   BUN 20 02/27/2018   CREATININE 0.80 02/27/2018   BILITOT 0.4 02/20/2016   ALKPHOS 67 02/20/2016   AST 19 02/20/2016   ALT 22 02/20/2016   PROT 6.9 02/20/2016   ALBUMIN 4.2 02/20/2016   CALCIUM 9.9 02/27/2018   GFR 75.12 02/20/2016   Lab Results   Component Value Date   CHOL 161 02/20/2016   Lab Results  Component Value Date   HDL 46.20 02/20/2016   Lab Results  Component Value Date   LDLCALC 100 (H) 02/20/2016   Lab Results  Component Value Date   TRIG 73.0 02/20/2016   Lab Results  Component Value Date   CHOLHDL 3 02/20/2016   No results found for: HGBA1C    Assessment & Plan:  1. Screening for cervical cancer Pap done at today's visit and patient will be notified of results and if further treatment needed. Handout on preventative care for women aged 68 and higher provided as  part of After Visit Summary - Cytology - PAP  2. Cervical polyp Patient with possible small cervical polyp vs. Irritation at the cervical os for which patient will be referred to GYN for further evaluation and treatment - Ambulatory referral to Obstetrics / Gynecology  3. Female fertility problem Referral to OB/GYN for evaluation of fertility - Ambulatory referral to Obstetrics / Gynecology  4. Vaginal discharge Patient with presence of vaginal discharge on exam and patient requests testing for sexually transmitted infections or presence of BV/candida. She will be notified of the results and if further treatment is needed based on the results - Cervicovaginal ancillary only  -suggested use of otc supplement Evening Primrose to see if this helps with breast discomfort due to the presence of a breast cyst  An After Visit Summary was printed and given to the patient.  Follow-up: Return if symptoms worsen or fail to improve, for yearly well exam and as needed.   Cain Saupe, MD

## 2018-05-19 ENCOUNTER — Other Ambulatory Visit: Payer: Self-pay | Admitting: Family Medicine

## 2018-05-19 DIAGNOSIS — B9689 Other specified bacterial agents as the cause of diseases classified elsewhere: Secondary | ICD-10-CM

## 2018-05-19 DIAGNOSIS — N760 Acute vaginitis: Principal | ICD-10-CM

## 2018-05-19 LAB — CERVICOVAGINAL ANCILLARY ONLY
Bacterial vaginitis: POSITIVE — AB
Candida vaginitis: NEGATIVE
Chlamydia: NEGATIVE
Neisseria Gonorrhea: NEGATIVE
Trichomonas: NEGATIVE

## 2018-05-19 MED ORDER — METRONIDAZOLE 500 MG PO TABS: 500.0000 mg | ORAL_TABLET | Freq: Two times a day (BID) | ORAL | 0 refills | Status: DC

## 2018-05-19 NOTE — Progress Notes (Signed)
Patient ID: Danielle Cannon, female   DOB: 03/03/1975, 43 y.o.   MRN: [id number]   Patient with evidence of bacterial vaginitis on ancillary cytology. Patient will be notified of the results and RX will be sent in for metronidazole 500 mg twice daily for 7 days

## 2018-05-23 LAB — CYTOLOGY - PAP
Diagnosis: NEGATIVE
HPV: NOT DETECTED

## 2018-05-24 ENCOUNTER — Encounter: Payer: Self-pay | Admitting: Family Medicine

## 2018-05-28 ENCOUNTER — Ambulatory Visit: Payer: Self-pay | Admitting: Obstetrics & Gynecology

## 2018-05-29 ENCOUNTER — Other Ambulatory Visit: Payer: Self-pay | Admitting: Family Medicine

## 2018-05-29 ENCOUNTER — Encounter: Payer: Self-pay | Admitting: Family Medicine

## 2018-05-29 DIAGNOSIS — R430 Anosmia: Secondary | ICD-10-CM

## 2018-05-29 NOTE — Telephone Encounter (Signed)
Patient mychart concern.

## 2018-05-29 NOTE — Progress Notes (Signed)
Patient ID: Danielle Cannon, female   DOB: 08-25-1975, 43 y.o.   MRN: [id number]   Patient left my chart message regarding the loss of sensation of smell and taste since early March.  Patient states that she forgot to mention this at her recent well visit but would like a referral for further evaluation.  ENT referral will be placed

## 2018-06-10 ENCOUNTER — Other Ambulatory Visit: Payer: Self-pay | Admitting: Family Medicine

## 2018-06-10 ENCOUNTER — Encounter: Payer: Self-pay | Admitting: Family Medicine

## 2018-06-10 DIAGNOSIS — R430 Anosmia: Secondary | ICD-10-CM

## 2018-06-10 NOTE — Telephone Encounter (Signed)
[facility] referral request

## 2018-06-10 NOTE — Progress Notes (Signed)
Patient ID: Danielle Cannon, female   DOB: June 24, 1975, 43 y.o.   MRN: [id number]   My chart message received from patient that she would like to now proceed with ENT referral due to lack of sense of smell and taste which is been ongoing.  No COVID-19 symptoms.

## 2018-06-18 ENCOUNTER — Encounter: Payer: Self-pay | Admitting: Family Medicine

## 2018-06-19 ENCOUNTER — Telehealth: Payer: Self-pay | Admitting: Family Medicine

## 2018-06-19 NOTE — Telephone Encounter (Signed)
Per quest of the nurse I called Pt and informed her that her [employer] it show active at this time she dopes not need the financial application they sent her by mistake

## 2018-07-07 ENCOUNTER — Other Ambulatory Visit: Payer: Self-pay

## 2018-07-08 ENCOUNTER — Encounter: Payer: Self-pay | Admitting: Obstetrics & Gynecology

## 2018-07-08 ENCOUNTER — Ambulatory Visit: Payer: 59 | Admitting: Obstetrics & Gynecology

## 2018-07-08 ENCOUNTER — Encounter: Payer: Self-pay | Admitting: *Deleted

## 2018-07-08 VITALS — BP 120/70 | Ht 66.0 in | Wt 161.0 lb

## 2018-07-08 DIAGNOSIS — R350 Frequency of micturition: Secondary | ICD-10-CM | POA: Diagnosis not present

## 2018-07-08 DIAGNOSIS — N979 Female infertility, unspecified: Secondary | ICD-10-CM | POA: Diagnosis not present

## 2018-07-08 DIAGNOSIS — Z8041 Family history of malignant neoplasm of ovary: Secondary | ICD-10-CM

## 2018-07-08 DIAGNOSIS — N841 Polyp of cervix uteri: Secondary | ICD-10-CM

## 2018-07-08 MED ORDER — CIPROFLOXACIN HCL 500 MG PO TABS: 500.0000 mg | ORAL_TABLET | Freq: Two times a day (BID) | ORAL | 0 refills | Status: AC

## 2018-07-08 NOTE — Progress Notes (Signed)
    Danielle Cannon January 14, 1976 [id number]        43 y.o.  [id number] Stable boyfriend x 6 yrs  RP: Possible cervical polyp vs irritation per exam with Fam MD on 05/16/2018  HPI: Menses regular normal every month. No BTB.  No pelvic pain.  No pain with IC.  No postcoital bleeding.  Not on any contraception, would like to conceive.  BV on wet prep, treated with Metronidazole. Pap test negative 04/2018.   OB History  Gravida Para Term Preterm AB Living  4 2     2 2   SAB TAB Ectopic Multiple Live Births               # Outcome Date GA Lbr Len/2nd Weight Sex Delivery Anes PTL Lv  4 AB           3 AB           2 Para           1 Para             Past medical history,surgical history, problem list, medications, allergies, family history and social history were all reviewed and documented in the EPIC chart.   Directed ROS with pertinent positives and negatives documented in the history of present illness/assessment and plan.  Exam:  Vitals:   07/08/18 1419  BP: 120/70  Weight: 161 lb (73 kg)  Height: 5\' 6"  (1.676 m)   General appearance:  Normal  Abdomen: Normal  Gynecologic exam: Vulva normal.  Speculum:  Cervix normal, no lesion, no erythema, no polyp seen.  Vagina normal. Normal vaginal secretions.  Bimanual exam:  Uterus AV, normal volume, mobile, NT.  No adnexal mass felt, NT bilaterally.  U/A: Dark yellow cloudy, protein negative, nitrite negative, white blood cells 20-40, red blood cells 3-10, moderate bacteria.  Pending urine culture.   Assessment/Plan:  43 y.o. [id number]   1. Cervical polyp No cervical polyp on gyn exam today, cervix normal.  Patient reassured.  Recent Pap test negative.  Vaginal secretions normal post treatment of BV with Metronidazole. - US Transvaginal Non-OB; Future  2. Urinary frequency U/A abnormal, probable Acute Cystitis.  Pending U. Culture.  Decision to start treatment now with Cipro 500 mg BID x 5 days.  Usage reviewed and prescription sent to  pharmacy. - Urinalysis,Complete w/RFL Culture  3. Family history of ovarian cancer Mother with advanced Ovarian Cancer at 53 yo, deceased a few yrs later.  Per patient, mother's genetic testing was negative.  Will screen patient with a Pelvic US at f/u.  Benefit of BCPs to decrease the risk of ovarian cancer discussed with patient.  Declines contraception at this time. 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  Declines investigation and treatment at this time.  Other orders - ciprofloxacin (CIPRO) 500 MG tablet; Take 1 tablet (500 mg total) by mouth 2 (two) times daily for 5 days.  Counseling on above issues and coordination of care >50% x 45 minutes.  Princess Bruins MD, 2:27 PM 07/08/2018

## 2018-07-09 ENCOUNTER — Encounter: Payer: Self-pay | Admitting: Obstetrics & Gynecology

## 2018-07-09 NOTE — Patient Instructions (Signed)
1. Cervical polyp No cervical polyp on gyn exam today, cervix normal.  Patient reassured.  Recent Pap test negative.  Vaginal secretions normal post treatment of BV with Metronidazole. - US Transvaginal Non-OB; Future  2. Urinary frequency U/A abnormal, probable Acute Cystitis.  Pending U. Culture.  Decision to start treatment now with Cipro 500 mg BID x 5 days.  Usage reviewed and prescription sent to pharmacy. - Urinalysis,Complete w/RFL Culture  3. Family history of ovarian cancer Mother with advanced Ovarian Cancer at 22 yo, deceased a few yrs later.  Per patient, mother's genetic testing was negative.  Will screen patient with a Pelvic US at f/u.  Benefit of BCPs to decrease the risk of ovarian cancer discussed with patient.  Declines contraception at this time. 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  Declines investigation and treatment at this time.  Other orders - ciprofloxacin (CIPRO) 500 MG tablet; Take 1 tablet (500 mg total) by mouth 2 (two) times daily for 5 days.  Corabelle, it was a pleasure meeting you today!  I will inform you of your results as soon as they are available.

## 2018-07-10 LAB — URINALYSIS, COMPLETE W/RFL CULTURE
Bilirubin Urine: NEGATIVE
Glucose, UA: NEGATIVE
Hyaline Cast: NONE SEEN /LPF
Ketones, ur: NEGATIVE
Nitrites, Initial: NEGATIVE
Protein, ur: NEGATIVE
Specific Gravity, Urine: 1.02 (ref 1.001–1.03)
pH: 5.5 (ref 5.0–8.0)

## 2018-07-10 LAB — URINE CULTURE
MICRO NUMBER:: 578888
SPECIMEN QUALITY:: ADEQUATE

## 2018-07-10 LAB — CULTURE INDICATED

## 2018-08-18 ENCOUNTER — Ambulatory Visit: Payer: 59 | Admitting: Obstetrics & Gynecology

## 2018-08-18 ENCOUNTER — Other Ambulatory Visit: Payer: 59

## 2018-08-20 ENCOUNTER — Other Ambulatory Visit: Payer: Self-pay

## 2018-08-21 ENCOUNTER — Other Ambulatory Visit: Payer: 59

## 2018-08-21 ENCOUNTER — Ambulatory Visit: Payer: 59 | Admitting: Obstetrics & Gynecology

## 2018-08-21 ENCOUNTER — Encounter: Payer: Self-pay | Admitting: Family Medicine

## 2018-09-22 ENCOUNTER — Ambulatory Visit (INDEPENDENT_AMBULATORY_CARE_PROVIDER_SITE_OTHER): Payer: 59 | Admitting: Otolaryngology

## 2018-09-22 DIAGNOSIS — J343 Hypertrophy of nasal turbinates: Secondary | ICD-10-CM

## 2018-09-22 DIAGNOSIS — R430 Anosmia: Secondary | ICD-10-CM

## 2018-09-22 DIAGNOSIS — J310 Chronic rhinitis: Secondary | ICD-10-CM

## 2018-09-22 DIAGNOSIS — J342 Deviated nasal septum: Secondary | ICD-10-CM

## 2018-09-25 ENCOUNTER — Other Ambulatory Visit: Payer: 59

## 2018-09-25 ENCOUNTER — Ambulatory Visit: Payer: 59 | Admitting: Obstetrics & Gynecology

## 2018-09-25 DIAGNOSIS — Z0289 Encounter for other administrative examinations: Secondary | ICD-10-CM

## 2018-09-26 ENCOUNTER — Other Ambulatory Visit: Payer: Self-pay | Admitting: *Deleted

## 2018-09-26 DIAGNOSIS — Z8041 Family history of malignant neoplasm of ovary: Secondary | ICD-10-CM

## 2018-11-13 ENCOUNTER — Other Ambulatory Visit: Payer: 59

## 2018-11-13 ENCOUNTER — Ambulatory Visit: Payer: 59 | Admitting: Obstetrics & Gynecology

## 2018-11-28 ENCOUNTER — Encounter: Payer: Self-pay | Admitting: Family Medicine

## 2018-12-02 ENCOUNTER — Encounter: Payer: Self-pay | Admitting: Family Medicine

## 2018-12-03 ENCOUNTER — Encounter (HOSPITAL_COMMUNITY): Payer: Self-pay | Admitting: Emergency Medicine

## 2018-12-03 ENCOUNTER — Emergency Department (HOSPITAL_COMMUNITY)
Admission: EM | Admit: 2018-12-03 | Discharge: 2018-12-04 | Disposition: A | Discharge status: Home / Self Care | Payer: 59 | Attending: Emergency Medicine | Admitting: Emergency Medicine

## 2018-12-03 DIAGNOSIS — R0789 Other chest pain: Secondary | ICD-10-CM | POA: Insufficient documentation

## 2018-12-03 DIAGNOSIS — Z87891 Personal history of nicotine dependence: Secondary | ICD-10-CM | POA: Insufficient documentation

## 2018-12-03 DIAGNOSIS — Z3A00 Weeks of gestation of pregnancy not specified: Secondary | ICD-10-CM | POA: Insufficient documentation

## 2018-12-03 DIAGNOSIS — Y999 Unspecified external cause status: Secondary | ICD-10-CM | POA: Insufficient documentation

## 2018-12-03 DIAGNOSIS — Y93I9 Activity, other involving external motion: Secondary | ICD-10-CM | POA: Diagnosis not present

## 2018-12-03 DIAGNOSIS — Z79899 Other long term (current) drug therapy: Secondary | ICD-10-CM | POA: Diagnosis not present

## 2018-12-03 DIAGNOSIS — Y92410 Unspecified street and highway as the place of occurrence of the external cause: Secondary | ICD-10-CM | POA: Insufficient documentation

## 2018-12-03 DIAGNOSIS — O9A219 Injury, poisoning and certain other consequences of external causes complicating pregnancy, unspecified trimester: Secondary | ICD-10-CM | POA: Insufficient documentation

## 2018-12-03 LAB — BASIC METABOLIC PANEL
Anion gap: 9 (ref 5–15)
BUN: 8 mg/dL (ref 6–20)
CO2: 22 mmol/L (ref 22–32)
Calcium: 9.2 mg/dL (ref 8.9–10.3)
Chloride: 104 mmol/L (ref 98–111)
Creatinine, Ser: 0.82 mg/dL (ref 0.44–1.00)
GFR calc Af Amer: >60 mL/min (ref >60)
GFR calc non Af Amer: >60 mL/min (ref >60)
Glucose, Bld: 119 mg/dL — ABNORMAL HIGH (ref 70–99)
Potassium: 3.5 mmol/L (ref 3.5–5.1)
Sodium: 135 mmol/L (ref 135–145)

## 2018-12-03 LAB — CBC
HCT: 38.2 % (ref 36.0–46.0)
Hemoglobin: 12.7 g/dL (ref 12.0–15.0)
MCH: 29.4 pg (ref 26.0–34.0)
MCHC: 33.2 g/dL (ref 30.0–36.0)
MCV: 88.4 fL (ref 80.0–100.0)
Platelets: 357 10*3/uL (ref 150–400)
RBC: 4.32 MIL/uL (ref 3.87–5.11)
RDW: 13.2 % (ref 11.5–15.5)
WBC: 13.3 10*3/uL — ABNORMAL HIGH (ref 4.0–10.5)
nRBC: 0 % (ref 0.0–0.2)

## 2018-12-03 LAB — I-STAT BETA HCG BLOOD, ED (MC, WL, AP ONLY): I-stat hCG, quantitative: >2000 m[IU]/mL — ABNORMAL HIGH (ref <5)
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
## 2018-12-04 NOTE — ED Notes (Signed)
Patient verbalizes understanding of discharge instructions. Opportunity for questioning and answers were provided. Armband removed by staff, pt discharged from ED ambulatory.   
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
## 2018-12-16 ENCOUNTER — Encounter: Payer: Self-pay | Admitting: *Deleted

## 2018-12-17 ENCOUNTER — Other Ambulatory Visit: Payer: Self-pay

## 2018-12-17 ENCOUNTER — Ambulatory Visit: Payer: 59 | Attending: Family Medicine | Admitting: Physician Assistant

## 2018-12-17 DIAGNOSIS — M542 Cervicalgia: Secondary | ICD-10-CM

## 2018-12-17 DIAGNOSIS — Z3201 Encounter for pregnancy test, result positive: Secondary | ICD-10-CM

## 2018-12-17 NOTE — Progress Notes (Signed)
Patient verified DOB Patient has taken medication Patient denies pain at this time. Patient is currently pregnant.
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
## 2018-12-20 ENCOUNTER — Encounter: Payer: Self-pay | Admitting: Family Medicine

## 2018-12-22 ENCOUNTER — Encounter: Payer: Self-pay | Admitting: Family Medicine

## 2018-12-22 ENCOUNTER — Telehealth: Payer: Self-pay | Admitting: Critical Care Medicine

## 2018-12-22 NOTE — Telephone Encounter (Signed)
Opened in error

## 2018-12-30 ENCOUNTER — Telehealth: Payer: Self-pay | Admitting: General Practice

## 2018-12-30 NOTE — Telephone Encounter (Signed)
Left message on VM for pt to give our office a call to schedule NOB appt.

## 2019-01-02 DIAGNOSIS — Z3480 Encounter for supervision of other normal pregnancy, unspecified trimester: Secondary | ICD-10-CM | POA: Diagnosis not present

## 2019-01-02 DIAGNOSIS — Z3201 Encounter for pregnancy test, result positive: Secondary | ICD-10-CM | POA: Diagnosis not present

## 2019-01-02 LAB — OB RESULTS CONSOLE GC/CHLAMYDIA
Chlamydia: NEGATIVE
Gonorrhea: NEGATIVE

## 2019-01-02 LAB — OB RESULTS CONSOLE RPR: RPR: NONREACTIVE

## 2019-01-02 LAB — OB RESULTS CONSOLE ABO/RH: RH Type: POSITIVE

## 2019-01-02 LAB — OB RESULTS CONSOLE ANTIBODY SCREEN: Antibody Screen: NEGATIVE

## 2019-01-02 LAB — OB RESULTS CONSOLE HEPATITIS B SURFACE ANTIGEN: Hepatitis B Surface Ag: NEGATIVE

## 2019-01-02 LAB — OB RESULTS CONSOLE RUBELLA ANTIBODY, IGM: Rubella: IMMUNE

## 2019-01-22 DIAGNOSIS — O3680X9 Pregnancy with inconclusive fetal viability, other fetus: Secondary | ICD-10-CM | POA: Diagnosis not present

## 2019-01-23 NOTE — L&D Delivery Note (Signed)
Delivery Note At 10:49 PM a viable and healthy female was delivered via Vaginal, Spontaneous (Presentation:  right Occiput Anterior).  APGAR: 9, 9; weight pending .   Placenta status: Spontaneous, Intact.  Cord: 3 vessels with the the cord wrapped around the baby's legs  The patient pushed for approximately 22 minutes and delivered a vigorous female infant in the vertex ROA presentation.  Following delivery the baby was passed to the maternal abdomen.  After a 1 minute delay, the umbilical cord was clamped and cut. Apgar scores of 9 at 1 and 9 at 5 minutes.  Weight is pending.  The placenta delivered spontaneously and intact.  No perineal lacerations required repair.  EBL 137 cc.  Mom and baby are doing well following delivery  Anesthesia: Epidural Episiotomy: None Lacerations: None Suture Repair: NA Est. Blood Loss (mL): 137  Mom to postpartum.  Baby to Couplet care / Skin to Skin.  Waynard Reeds 07/29/2019, 11:05 PM

## 2019-02-17 ENCOUNTER — Ambulatory Visit: Payer: 59 | Attending: Internal Medicine

## 2019-02-17 ENCOUNTER — Other Ambulatory Visit: Payer: 59

## 2019-02-17 DIAGNOSIS — Z20822 Contact with and (suspected) exposure to covid-19: Secondary | ICD-10-CM | POA: Diagnosis not present

## 2019-02-17 DIAGNOSIS — Z3482 Encounter for supervision of other normal pregnancy, second trimester: Secondary | ICD-10-CM | POA: Diagnosis not present

## 2019-02-18 LAB — NOVEL CORONAVIRUS, NAA: SARS-CoV-2, NAA: NOT DETECTED

## 2019-03-23 DIAGNOSIS — Z363 Encounter for antenatal screening for malformations: Secondary | ICD-10-CM | POA: Diagnosis not present

## 2019-04-14 ENCOUNTER — Encounter (HOSPITAL_COMMUNITY): Payer: Self-pay | Admitting: Emergency Medicine

## 2019-04-14 ENCOUNTER — Inpatient Hospital Stay (HOSPITAL_COMMUNITY)
Admission: AD | Admit: 2019-04-14 | Discharge: 2019-04-14 | Disposition: A | Discharge status: Home / Self Care | Payer: Medicaid Other | Attending: Obstetrics & Gynecology | Admitting: Obstetrics & Gynecology

## 2019-04-14 ENCOUNTER — Other Ambulatory Visit: Payer: Self-pay

## 2019-04-14 DIAGNOSIS — Z3A23 23 weeks gestation of pregnancy: Secondary | ICD-10-CM | POA: Insufficient documentation

## 2019-04-14 DIAGNOSIS — Z87891 Personal history of nicotine dependence: Secondary | ICD-10-CM | POA: Diagnosis not present

## 2019-04-14 DIAGNOSIS — O09522 Supervision of elderly multigravida, second trimester: Secondary | ICD-10-CM | POA: Diagnosis not present

## 2019-04-14 DIAGNOSIS — O4692 Antepartum hemorrhage, unspecified, second trimester: Secondary | ICD-10-CM | POA: Diagnosis not present

## 2019-04-14 DIAGNOSIS — R109 Unspecified abdominal pain: Secondary | ICD-10-CM | POA: Diagnosis not present

## 2019-04-14 DIAGNOSIS — O4690 Antepartum hemorrhage, unspecified, unspecified trimester: Secondary | ICD-10-CM

## 2019-04-14 DIAGNOSIS — O26852 Spotting complicating pregnancy, second trimester: Secondary | ICD-10-CM

## 2019-04-14 LAB — URINALYSIS, ROUTINE W REFLEX MICROSCOPIC
Bilirubin Urine: NEGATIVE
Glucose, UA: NEGATIVE mg/dL
Hgb urine dipstick: NEGATIVE
Ketones, ur: 80 mg/dL — AB
Leukocytes,Ua: NEGATIVE
Nitrite: NEGATIVE
Protein, ur: NEGATIVE mg/dL
Specific Gravity, Urine: 1.016 (ref 1.005–1.030)
pH: 5 (ref 5.0–8.0)

## 2019-04-14 NOTE — Discharge Instructions (Signed)
Vaginal Bleeding During Pregnancy, Third Trimester ° °A small amount of bleeding (spotting) from the vagina is common during pregnancy. Sometimes the bleeding is normal and is not a problem, and sometimes it is a sign of something serious. Tell your doctor about any bleeding from your vagina right away. °Follow these instructions at home: °Activity °· Follow your doctor's instructions about how active you can be. Your doctor may recommend that you: °? Stay in bed and only get up to use the bathroom. °? Continue light activity. °· If needed, make plans for someone to help you with your normal activities. °· Ask your doctor if it is safe for you to drive. °· Do not lift anything that is heavier than 10 lb (4.5 kg) until your doctor says that this is safe. °· Do not have sex or orgasms until your doctor says that this is safe. °Medicines °· Take over-the-counter and prescription medicines only as told by your doctor. °· Do not take aspirin. It can cause bleeding. °General instructions °· Watch your condition for any changes. °· Write down: °? The number of pads you use each day. °? How often you change pads. °? How soaked (saturated) your pads are. °· Do not use tampons. °· Do not douche. °· If you pass any tissue from your vagina, save the tissue to show your doctor. °· Keep all follow-up visits as told by your doctor. This is important. °Contact a doctor if: °· You have vaginal bleeding at any time during pregnancy. °· You have cramps. °· You have a fever. °Get help right away if: °· You have very bad cramps. °· You have very bad pain in your back or belly (abdomen). °· You have a gush of fluid from your vagina. °· You pass large clots or a lot of tissue from your vagina. °· Your bleeding gets worse. °· You feel light-headed or weak. °· You pass out (faint). °· Your baby is moving less than usual, or not moving at all. °Summary °· Tell your doctor about any bleeding from your vagina right away. °· Follow instructions  from your doctor about how active you can be. You may need someone to help you with your normal activities. °This information is not intended to replace advice given to you by your health care provider. Make sure you discuss any questions you have with your health care provider. °Document Revised: 04/29/2018 Document Reviewed: 04/11/2016 °Elsevier Patient Education © 2020 [employer] ° °

## 2019-04-14 NOTE — ED Triage Notes (Addendum)
Pt is [redacted] weeks pregnant. Pt has had some cramping intermittently for two days. Pt states she noticed some bright red blood after cramping. Bleeding has slowed. This is her 5th pregnancy, 2 abortions, 3rd normal pregnancy. Pt states she is still cramping at this time- worsens when she walks. Pt is feeling baby move.

## 2019-04-14 NOTE — MAU Note (Signed)
Pt states when she got home from work she noticed blood in toilet. States she has had some mild cramping for the last few days. Only comes when walking or standing. States she did move furniture yesterday and today she did a lot a walking at work today. States she did have some "aching" in RLQ. Reports she feels good fetal movement. She denies vaginal exams or sex. Reports that she is currently wearing a pad, but there is not any blood on pad since the episode earlier today.

## 2019-04-14 NOTE — MAU Provider Note (Signed)
Chief Complaint:  Vaginal Bleeding   First Provider Initiated Contact with Patient 04/14/19 2023     HPI: Danielle Cannon is a 44 y.o. [id number] at 32w0dwho presents to maternity admissions reporting passing blood after bowel movement.  States wiped rectal area first and saw no blood but saw it on vaginal area.  No bleeding since then.  Had some mild lower right side pain after doing physical work and walking.  . She reports good fetal movement, denies LOF, vaginal itching/burning, urinary symptoms, h/a, dizziness, n/v, diarrhea, constipation or fever/chills.    Vaginal Bleeding The patient's primary symptoms include pelvic pain and vaginal bleeding. The patient's pertinent negatives include no genital itching, genital lesions or genital odor. The current episode started today. The problem has been resolved. The pain is mild. The problem affects the right side. She is pregnant. Pertinent negatives include no back pain, constipation, diarrhea, dysuria, fever, frequency, headaches, hematuria, nausea or vomiting. The vaginal discharge was bloody. The vaginal bleeding is lighter than menses. She has not been passing clots. She has not been passing tissue. Nothing aggravates the symptoms. She has tried nothing for the symptoms.    RN Note: Pt states when she got home from work she noticed blood in toilet. States she has had some mild cramping for the last few days. Only comes when walking or standing. States she did move furniture yesterday and today she did a lot a walking at work today. States she did have some "aching" in RLQ. Reports she feels good fetal movement. She denies vaginal exams or sex. Reports that she is currently wearing a pad, but there is not any blood on pad since the episode earlier today.   Past Medical History: Past Medical History:  Diagnosis Date  . History of chicken pox 1990    Past obstetric history: OB History  Gravida Para Term Preterm AB Living  5 2 2  0 2 2  SAB TAB Ectopic  Multiple Live Births  0 2 0 0 2    # Outcome Date GA Lbr Len/2nd Weight Sex Delivery Anes PTL Lv  5 Current           4 TAB           3 TAB           2 Term      Vag-Spont     1 Term      Vag-Spont       Past Surgical History: Past Surgical History:  Procedure Laterality Date  . CYST REMOVAL HAND Right 2005    Family History: Family History  Problem Relation Age of Onset  . Drug abuse Mother   . Diabetes Mother   . Ovarian cancer Mother 75  . Cancer Mother   . Alcoholism Father   . Hypertension Father   . Cancer Maternal Grandmother        lymphoma  . Alzheimer's disease Maternal Grandfather   . Cancer Paternal Grandfather        throat    Social History: Social History   Tobacco Use  . Smoking status: Former Smoker    Types: Cigarettes    Quit date: 02/17/2004    Years since quitting: 15.1  . Smokeless tobacco: Never Used  Substance Use Topics  . Alcohol use: Not Currently    Alcohol/week: 7.0 standard drinks    Types: 7 Glasses of wine per week    Comment: daily  . Drug use: No    Allergies: No  Known Allergies  Meds:  Medications Prior to Admission  Medication Sig Dispense Refill Last Dose  . Ascorbic Acid (VITAMIN C PO) Take by mouth.     . ELDERBERRY PO Take by mouth.     . FOLIC ACID PO Take by mouth.     Marland Kitchen glucosamine-chondroitin 500-400 MG tablet Take 1 tablet by mouth 3 (three) times daily.     . traZODone (DESYREL) 50 MG tablet Take 0.5-1 tablets (25-50 mg total) by mouth at bedtime as needed for sleep. 30 tablet 3   . VITAMIN E PO Take by mouth.       I have reviewed patient's Past Medical Hx, Surgical Hx, Family Hx, Social Hx, medications and allergies.   ROS:  Review of Systems  Constitutional: Negative for fever.  Gastrointestinal: Negative for constipation, diarrhea, nausea and vomiting.  Genitourinary: Positive for pelvic pain and vaginal bleeding. Negative for dysuria, frequency and hematuria.  Musculoskeletal: Negative for back  pain.  Neurological: Negative for headaches.   Other systems negative  Physical Exam   Patient Vitals for the past 24 hrs:  BP Temp Temp src Pulse Resp SpO2 Height Weight  04/14/19 1933 105/67 98.6 F (37 C) Oral 80 16 -- 5\' 5"  (1.651 m) 78.8 kg  04/14/19 1821 104/76 98 F (36.7 C) Oral 81 15 100 % -- --  04/14/19 1818 -- -- -- -- -- -- 5\' 5"  (1.651 m) 79.4 kg   Constitutional: Well-developed, well-nourished female in no acute distress.  Cardiovascular: normal rate and rhythm Respiratory: normal effort, clear to auscultation bilaterally GI: Abd soft, non-tender, gravid appropriate for gestational age.   No rebound or guarding. MS: Extremities nontender, no edema, normal ROM Neurologic: Alert and oriented x 4.  GU: Neg CVAT.  PELVIC EXAM: Cervix pink, visually closed, without lesion, moderate white creamy discharge, vaginal walls and external genitalia normal   No blood or colored discharge in vault Dilation: Closed Effacement (%): Thick Station: Ballotable Exam by:: Hansel Feinstein, CNM   FHT:  Baseline 140 , moderate variability, accelerations present, no decelerations Contractions:  Rare   Labs: Results for orders placed or performed during the hospital encounter of 04/14/19 (from the past 24 hour(s))  Urinalysis, Routine w reflex microscopic     Status: Abnormal   Collection Time: 04/14/19  7:38 PM  Result Value Ref Range   Color, Urine YELLOW YELLOW   APPearance HAZY (A) CLEAR   Specific Gravity, Urine 1.016 1.005 - 1.030   pH 5.0 5.0 - 8.0   Glucose, UA NEGATIVE NEGATIVE mg/dL   Hgb urine dipstick NEGATIVE NEGATIVE   Bilirubin Urine NEGATIVE NEGATIVE   Ketones, ur 80 (A) NEGATIVE mg/dL   Protein, ur NEGATIVE NEGATIVE mg/dL   Nitrite NEGATIVE NEGATIVE   Leukocytes,Ua NEGATIVE NEGATIVE     Imaging:  No results found.  MAU Course/MDM: I have ordered labs and reviewed results. UA is clear NST reviewed, reassuring for gestational age Exam is totally negative for  any recent vaginal bleeding.  Suspect this may have been hemorrhoid bleed Consult Dr Nehemiah Settle with presentation, exam findings and test results.  Will discharge home.  Pt to return asap if bleeding recurs  Assessment: Single IUP at [redacted]w[redacted]d Bleeding, question source, vaginal vs rectal Reassuring fetal heart beat tracing  Plan: Discharge home Bleeding precautions Preterm Labor precautions and fetal kick counts Follow up in Office for prenatal visits   Encouraged to return here or to other Urgent Care/ED if she develops worsening of symptoms, increase in pain,  fever, or other concerning symptoms.   Pt stable at time of discharge.  Wynelle Bourgeois CNM, MSN Certified Nurse-Midwife 04/14/2019 8:23 PM

## 2019-04-14 NOTE — ED Provider Notes (Signed)
44 year old G5P2 who is [redacted] weeks pregnant presents with abdominal cramping and vaginal bleeding. She states the cramping started 2 days ago. It has been intermittent and worse with movement. Today cramping was worse and starts in the RLQ and radiates across the pelvis. She went to the bathroom this afternoon and she saw blood in the toilet so came to the ED. At this time the cramping is improved and bleeding is resolved. She feels baby moving. Pregnancy has been uncomplicated thus far. She follows with [facility] for her regular prenatal care.   On exam she is alert and cooperative. Heart is regular rate and rhythm. Lungs are CTA. She has a gravid uterus. Abdomen is non-tender.   MSE was initiated and I personally evaluated the patient and placed orders (if any) at  6:35 PM on April 14, 2019.  The patient appears stable so that the remainder of the MSE may be completed by another provider.   Bethel Born, PA-C 04/14/19 Luiz Iron    Eber Hong, MD 04/15/19 [phone]

## 2019-04-14 NOTE — ED Notes (Signed)
Pt cleared to go to MAU by ED PA

## 2019-04-20 DIAGNOSIS — Z362 Encounter for other antenatal screening follow-up: Secondary | ICD-10-CM | POA: Diagnosis not present

## 2019-05-13 DIAGNOSIS — Z3480 Encounter for supervision of other normal pregnancy, unspecified trimester: Secondary | ICD-10-CM | POA: Diagnosis not present

## 2019-05-13 DIAGNOSIS — Z23 Encounter for immunization: Secondary | ICD-10-CM | POA: Diagnosis not present

## 2019-07-09 DIAGNOSIS — Z3480 Encounter for supervision of other normal pregnancy, unspecified trimester: Secondary | ICD-10-CM | POA: Diagnosis not present

## 2019-07-09 DIAGNOSIS — O09523 Supervision of elderly multigravida, third trimester: Secondary | ICD-10-CM | POA: Diagnosis not present

## 2019-07-09 LAB — OB RESULTS CONSOLE GBS: GBS: NEGATIVE

## 2019-07-15 DIAGNOSIS — O09523 Supervision of elderly multigravida, third trimester: Secondary | ICD-10-CM | POA: Diagnosis not present

## 2019-07-17 ENCOUNTER — Other Ambulatory Visit: Payer: Self-pay | Admitting: Obstetrics & Gynecology

## 2019-07-18 ENCOUNTER — Inpatient Hospital Stay (HOSPITAL_COMMUNITY)
Admission: AD | Admit: 2019-07-18 | Discharge: 2019-07-18 | Disposition: A | Discharge status: Home / Self Care | Payer: Medicaid Other | Attending: Obstetrics & Gynecology | Admitting: Obstetrics & Gynecology

## 2019-07-18 ENCOUNTER — Other Ambulatory Visit: Payer: Self-pay

## 2019-07-18 ENCOUNTER — Inpatient Hospital Stay (HOSPITAL_BASED_OUTPATIENT_CLINIC_OR_DEPARTMENT_OTHER): Payer: Medicaid Other

## 2019-07-18 ENCOUNTER — Encounter (HOSPITAL_COMMUNITY): Payer: Self-pay | Admitting: Obstetrics & Gynecology

## 2019-07-18 DIAGNOSIS — Z3A36 36 weeks gestation of pregnancy: Secondary | ICD-10-CM | POA: Diagnosis not present

## 2019-07-18 DIAGNOSIS — Z79899 Other long term (current) drug therapy: Secondary | ICD-10-CM | POA: Diagnosis not present

## 2019-07-18 DIAGNOSIS — O368130 Decreased fetal movements, third trimester, not applicable or unspecified: Secondary | ICD-10-CM

## 2019-07-18 DIAGNOSIS — O368190 Decreased fetal movements, unspecified trimester, not applicable or unspecified: Secondary | ICD-10-CM

## 2019-07-18 DIAGNOSIS — Z3689 Encounter for other specified antenatal screening: Secondary | ICD-10-CM

## 2019-07-18 DIAGNOSIS — O09523 Supervision of elderly multigravida, third trimester: Secondary | ICD-10-CM | POA: Diagnosis not present

## 2019-07-18 DIAGNOSIS — Z87891 Personal history of nicotine dependence: Secondary | ICD-10-CM | POA: Diagnosis not present

## 2019-07-18 LAB — URINALYSIS, ROUTINE W REFLEX MICROSCOPIC
Bilirubin Urine: NEGATIVE
Glucose, UA: NEGATIVE mg/dL
Hgb urine dipstick: NEGATIVE
Ketones, ur: 5 mg/dL — AB
Leukocytes,Ua: NEGATIVE
Nitrite: NEGATIVE
Protein, ur: NEGATIVE mg/dL
Specific Gravity, Urine: 1.009 (ref 1.005–1.030)
pH: 6 (ref 5.0–8.0)

## 2019-07-18 NOTE — Discharge Instructions (Signed)
Fetal Movement Counts Patient Name: ________________________________________________ Patient Due Date: ____________________ What is a fetal movement count?  A fetal movement count is the number of times that you feel your baby move during a certain amount of time. This may also be called a fetal kick count. A fetal movement count is recommended for every pregnant woman. You may be asked to start counting fetal movements as early as week 28 of your pregnancy. Pay attention to when your baby is most active. You may notice your baby's sleep and wake cycles. You may also notice things that make your baby move more. You should do a fetal movement count:  When your baby is normally most active.  At the same time each day. A good time to count movements is while you are resting, after having something to eat and drink. How do I count fetal movements? 1. Find a quiet, comfortable area. Sit, or lie down on your side. 2. Write down the date, the start time and stop time, and the number of movements that you felt between those two times. Take this information with you to your health care visits. 3. Write down your start time when you feel the first movement. 4. Count kicks, flutters, swishes, rolls, and jabs. You should feel at least 10 movements. 5. You may stop counting after you have felt 10 movements, or if you have been counting for 2 hours. Write down the stop time. 6. If you do not feel 10 movements in 2 hours, contact your health care provider for further instructions. Your health care provider may want to do additional tests to assess your baby's well-being. Contact a health care provider if:  You feel fewer than 10 movements in 2 hours.  Your baby is not moving like he or she usually does. Date: ____________ Start time: ____________ Stop time: ____________ Movements: ____________ Date: ____________ Start time: ____________ Stop time: ____________ Movements: ____________ Date: ____________  Start time: ____________ Stop time: ____________ Movements: ____________ Date: ____________ Start time: ____________ Stop time: ____________ Movements: ____________ Date: ____________ Start time: ____________ Stop time: ____________ Movements: ____________ Date: ____________ Start time: ____________ Stop time: ____________ Movements: ____________ Date: ____________ Start time: ____________ Stop time: ____________ Movements: ____________ Date: ____________ Start time: ____________ Stop time: ____________ Movements: ____________ Date: ____________ Start time: ____________ Stop time: ____________ Movements: ____________ This information is not intended to replace advice given to you by your health care provider. Make sure you discuss any questions you have with your health care provider. Document Revised: 08/28/2018 Document Reviewed: 08/28/2018 Elsevier Patient Education  2020 [employer]vier Inc.  

## 2019-07-18 NOTE — MAU Note (Signed)
Pt reports to mau with complaints of dfm since yesterday evening.  Pt reports she felt some movement this morning but not as much as she normally does.  Pt reports some BH ctx but denies pain at this time.  PT denies LOF or vag bleeding.

## 2019-07-18 NOTE — MAU Provider Note (Signed)
History     CSN: [id number]  Arrival date and time: 07/18/19 7829   First Provider Initiated Contact with Patient 07/18/19 [phone]      Chief Complaint  Patient presents with  . Decreased Fetal Movement   44 y.o. [id number] @36 .4 wks presenting with decreased FM. She first noticed less strong movements last night, but still the same amount. She reports eating and drinking well, however admits to only a few sips of water today. Denies VB, LOF, and painful ctx. Her pregnancy has been complicated by AMA.  OB History    Gravida  5   Para  2   Term  2   Preterm  0   AB  2   Living  2     SAB  0   TAB  2   Ectopic  0   Multiple  0   Live Births  2           Past Medical History:  Diagnosis Date  . History of chicken pox 1990    Past Surgical History:  Procedure Laterality Date  . CYST REMOVAL HAND Right 2005    Family History  Problem Relation Age of Onset  . Drug abuse Mother   . Diabetes Mother   . Ovarian cancer Mother 34  . Cancer Mother   . Alcoholism Father   . Hypertension Father   . Cancer Maternal Grandmother        lymphoma  . Alzheimer's disease Maternal Grandfather   . Cancer Paternal Grandfather        throat    Social History   Tobacco Use  . Smoking status: Former Smoker    Types: Cigarettes    Quit date: 02/17/2004    Years since quitting: 15.4  . Smokeless tobacco: Never Used  Vaping Use  . Vaping Use: Never used  Substance Use Topics  . Alcohol use: Not Currently    Alcohol/week: 7.0 standard drinks    Types: 7 Glasses of wine per week    Comment: daily  . Drug use: No    Allergies: No Known Allergies  No medications prior to admission.    Review of Systems  Gastrointestinal: Negative for abdominal pain.  Genitourinary: Negative for vaginal bleeding and vaginal discharge.   Physical Exam   Blood pressure 115/69, pulse 72, temperature 97.6 F (36.4 C), temperature source Oral, resp. rate 16, last menstrual period  10/29/2018, SpO2 99 %.  Physical Exam Vitals and nursing note reviewed. Exam conducted with a chaperone present.  Constitutional:      General: She is not in acute distress. HENT:     Head: Normocephalic and atraumatic.  Cardiovascular:     Rate and Rhythm: Normal rate.  Pulmonary:     Effort: Pulmonary effort is normal. No respiratory distress.  Abdominal:     Palpations: Abdomen is soft.     Tenderness: There is no abdominal tenderness.  Musculoskeletal:        General: Normal range of motion.     Cervical back: Normal range of motion.  Skin:    General: Skin is warm and dry.  Neurological:     General: No focal deficit present.     Mental Status: She is alert and oriented to person, place, and time.  Psychiatric:        Mood and Affect: Mood normal.   EFM: 135 bpm, mod variability, + accels, no decels Toco: rare  Results for orders placed or performed during the  hospital encounter of 07/18/19 (from the past 24 hour(s))  Urinalysis, Routine w reflex microscopic     Status: Abnormal   Collection Time: 07/18/19  9:28 AM  Result Value Ref Range   Color, Urine STRAW (A) YELLOW   APPearance CLEAR CLEAR   Specific Gravity, Urine 1.009 1.005 - 1.030   pH 6.0 5.0 - 8.0   Glucose, UA NEGATIVE NEGATIVE mg/dL   Hgb urine dipstick NEGATIVE NEGATIVE   Bilirubin Urine NEGATIVE NEGATIVE   Ketones, ur 5 (A) NEGATIVE mg/dL   Protein, ur NEGATIVE NEGATIVE mg/dL   Nitrite NEGATIVE NEGATIVE   Leukocytes,Ua NEGATIVE NEGATIVE   MAU Course  Procedures Po hydration and snack provided  MDM BPP ordered. BPP 8/8, AFI 16, NST reactive >10/10. Pt feeling many FMs while on EFM. Discussed potential for change in consistency of movements as pregnancy progresses near term but frequency should not change, and she should expect to feel FM everyday. Informed her to notify MD for decreased or concerns in FM. Stable for discharge home.   Assessment and Plan   1. [redacted] weeks gestation of pregnancy    2. Decreased fetal movement   3. NST (non-stress test) reactive    Discharge home Follow [facility] next week as scheduled [facility]  Allergies as of 07/18/2019   No Known Allergies     Medication List    STOP taking these medications   ELDERBERRY PO   glucosamine-chondroitin 500-400 MG tablet   traZODone 50 MG tablet Commonly known as: DESYREL   VITAMIN C PO   VITAMIN E PO     TAKE these medications   FOLIC ACID PO Take by mouth.      Julianne Handler, CNM 07/18/2019, 11:33 AM

## 2019-07-22 ENCOUNTER — Telehealth (HOSPITAL_COMMUNITY): Payer: Self-pay | Admitting: *Deleted

## 2019-07-22 ENCOUNTER — Encounter (HOSPITAL_COMMUNITY): Payer: Self-pay | Admitting: *Deleted

## 2019-07-22 DIAGNOSIS — O09523 Supervision of elderly multigravida, third trimester: Secondary | ICD-10-CM | POA: Diagnosis not present

## 2019-07-22 NOTE — Telephone Encounter (Signed)
Preadmission screen  

## 2019-07-28 ENCOUNTER — Other Ambulatory Visit (HOSPITAL_COMMUNITY)
Admission: RE | Admit: 2019-07-28 | Discharge: 2019-07-28 | Disposition: A | Discharge status: Home / Self Care | Payer: Medicaid Other | Source: Ambulatory Visit | Attending: Obstetrics | Admitting: Obstetrics

## 2019-07-28 LAB — SARS CORONAVIRUS 2 (TAT 6-24 HRS): SARS Coronavirus 2: NEGATIVE

## 2019-07-29 ENCOUNTER — Inpatient Hospital Stay (HOSPITAL_COMMUNITY)
Admission: RE | Admit: 2019-07-29 | Discharge: 2019-07-31 | DRG: 807 | Disposition: A | Discharge status: Home / Self Care | Payer: Medicaid Other | Attending: Obstetrics and Gynecology | Admitting: Obstetrics and Gynecology

## 2019-07-29 ENCOUNTER — Other Ambulatory Visit: Payer: Self-pay

## 2019-07-29 ENCOUNTER — Encounter (HOSPITAL_COMMUNITY): Payer: Self-pay | Admitting: Obstetrics & Gynecology

## 2019-07-29 ENCOUNTER — Inpatient Hospital Stay (HOSPITAL_COMMUNITY): Payer: Medicaid Other | Admitting: Anesthesiology

## 2019-07-29 ENCOUNTER — Inpatient Hospital Stay (HOSPITAL_COMMUNITY): Payer: Medicaid Other

## 2019-07-29 DIAGNOSIS — Z20822 Contact with and (suspected) exposure to covid-19: Secondary | ICD-10-CM | POA: Diagnosis not present

## 2019-07-29 DIAGNOSIS — Z3A39 39 weeks gestation of pregnancy: Secondary | ICD-10-CM

## 2019-07-29 DIAGNOSIS — O09529 Supervision of elderly multigravida, unspecified trimester: Secondary | ICD-10-CM

## 2019-07-29 DIAGNOSIS — Z87891 Personal history of nicotine dependence: Secondary | ICD-10-CM

## 2019-07-29 DIAGNOSIS — O26893 Other specified pregnancy related conditions, third trimester: Secondary | ICD-10-CM | POA: Diagnosis not present

## 2019-07-29 HISTORY — DX: Headache, unspecified: R51.9

## 2019-07-29 LAB — CBC
HCT: 34.9 % — ABNORMAL LOW (ref 36.0–46.0)
Hemoglobin: 11.4 g/dL — ABNORMAL LOW (ref 12.0–15.0)
MCH: 28.1 pg (ref 26.0–34.0)
MCHC: 32.7 g/dL (ref 30.0–36.0)
MCV: 86 fL (ref 80.0–100.0)
Platelets: 236 10*3/uL (ref 150–400)
RBC: 4.06 MIL/uL (ref 3.87–5.11)
RDW: 16.4 % — ABNORMAL HIGH (ref 11.5–15.5)
WBC: 13 10*3/uL — ABNORMAL HIGH (ref 4.0–10.5)
nRBC: 0 % (ref 0.0–0.2)

## 2019-07-29 LAB — RPR: RPR Ser Ql: NONREACTIVE

## 2019-07-29 LAB — ABO/RH: ABO/RH(D): B POS

## 2019-07-29 LAB — TYPE AND SCREEN
ABO/RH(D): B POS
Antibody Screen: NEGATIVE

## 2019-07-29 MED ORDER — LACTATED RINGERS IV SOLN
500.0000 mL | Freq: Once | INTRAVENOUS | Status: AC
  Administered 2019-07-29: 500 mL via INTRAVENOUS

## 2019-07-29 MED ORDER — PHENYLEPHRINE 40 MCG/ML (10ML) SYRINGE FOR IV PUSH (FOR BLOOD PRESSURE SUPPORT)
80.0000 ug | PREFILLED_SYRINGE | INTRAVENOUS | Status: DC | PRN
  Filled 2019-07-29 (×2): qty 10

## 2019-07-29 MED ORDER — ONDANSETRON HCL 4 MG/2ML IJ SOLN
4.0000 mg | Freq: Four times a day (QID) | INTRAMUSCULAR | Status: DC | PRN
  Administered 2019-07-29: 4 mg via INTRAVENOUS
  Filled 2019-07-29: qty 2

## 2019-07-29 MED ORDER — OXYTOCIN-SODIUM CHLORIDE 30-0.9 UT/500ML-% IV SOLN
2.5000 [IU]/h | INTRAVENOUS | Status: DC
  Administered 2019-07-29: 2.5 [IU]/h via INTRAVENOUS
  Filled 2019-07-29: qty 500

## 2019-07-29 MED ORDER — OXYTOCIN-SODIUM CHLORIDE 30-0.9 UT/500ML-% IV SOLN
1.0000 m[IU]/min | INTRAVENOUS | Status: DC
  Administered 2019-07-29: 2 m[IU]/min via INTRAVENOUS
  Filled 2019-07-29: qty 500

## 2019-07-29 MED ORDER — TERBUTALINE SULFATE 1 MG/ML IJ SOLN: 0.2500 mg | Freq: Once | INTRAMUSCULAR | Status: DC | PRN

## 2019-07-29 MED ORDER — FENTANYL CITRATE (PF) 100 MCG/2ML IJ SOLN
50.0000 ug | INTRAMUSCULAR | Status: DC | PRN
  Administered 2019-07-29 (×2): 100 ug via INTRAVENOUS
  Filled 2019-07-29 (×2): qty 2

## 2019-07-29 MED ORDER — SODIUM CHLORIDE (PF) 0.9 % IJ SOLN
INTRAMUSCULAR | Status: DC | PRN
  Administered 2019-07-29: 12 mL/h via EPIDURAL

## 2019-07-29 MED ORDER — LIDOCAINE HCL (PF) 1 % IJ SOLN
INTRAMUSCULAR | Status: DC | PRN
  Administered 2019-07-29 (×2): 5 mL via EPIDURAL

## 2019-07-29 MED ORDER — LACTATED RINGERS IV SOLN: 500.0000 mL | INTRAVENOUS | Status: DC | PRN

## 2019-07-29 MED ORDER — EPHEDRINE 5 MG/ML INJ: 10.0000 mg | INTRAVENOUS | Status: DC | PRN

## 2019-07-29 MED ORDER — FENTANYL-BUPIVACAINE-NACL 0.5-0.125-0.9 MG/250ML-% EP SOLN
12.0000 mL/h | EPIDURAL | Status: DC | PRN
  Filled 2019-07-29: qty 250

## 2019-07-29 MED ORDER — ACETAMINOPHEN 325 MG PO TABS: 650.0000 mg | ORAL_TABLET | ORAL | Status: DC | PRN

## 2019-07-29 MED ORDER — LACTATED RINGERS IV SOLN: INTRAVENOUS | Status: DC

## 2019-07-29 MED ORDER — SOD CITRATE-CITRIC ACID 500-334 MG/5ML PO SOLN: 30.0000 mL | ORAL | Status: DC | PRN

## 2019-07-29 MED ORDER — DIPHENHYDRAMINE HCL 50 MG/ML IJ SOLN: 12.5000 mg | INTRAMUSCULAR | Status: DC | PRN

## 2019-07-29 MED ORDER — PHENYLEPHRINE 40 MCG/ML (10ML) SYRINGE FOR IV PUSH (FOR BLOOD PRESSURE SUPPORT)
80.0000 ug | PREFILLED_SYRINGE | INTRAVENOUS | Status: AC | PRN
  Administered 2019-07-29 (×3): 80 ug via INTRAVENOUS
  Filled 2019-07-29: qty 10

## 2019-07-29 MED ORDER — LIDOCAINE HCL (PF) 1 % IJ SOLN: 30.0000 mL | INTRAMUSCULAR | Status: DC | PRN

## 2019-07-29 MED ORDER — MISOPROSTOL 25 MCG QUARTER TABLET
25.0000 ug | ORAL_TABLET | ORAL | Status: DC | PRN
  Administered 2019-07-29 (×2): 25 ug via VAGINAL
  Filled 2019-07-29 (×2): qty 1

## 2019-07-29 MED ORDER — ACETAMINOPHEN 500 MG PO TABS
1000.0000 mg | ORAL_TABLET | Freq: Once | ORAL | Status: AC
  Administered 2019-07-29: 1000 mg via ORAL
  Filled 2019-07-29: qty 2

## 2019-07-29 MED ORDER — OXYTOCIN BOLUS FROM INFUSION
333.0000 mL | Freq: Once | INTRAVENOUS | Status: AC
  Administered 2019-07-29: 333 mL via INTRAVENOUS

## 2019-07-29 NOTE — H&P (Signed)
Danielle Cannon is a 44 y.o. female presenting for IOL for AMA  44 yo [id number]@ 39+0 presents for IOL for AMA. Her pregnancy has been complicated by AMA. She had an elevated hemoglobin A1c of 5.8% in pregnancy, however, passed the early 1 hr gtt and at 26 weeks. OB History    Gravida  5   Para  2   Term  2   Preterm  0   AB  2   Living  2     SAB  0   TAB  2   Ectopic  0   Multiple  0   Live Births  2          Past Medical History:  Diagnosis Date  . Headache   . History of chicken pox 1990   Past Surgical History:  Procedure Laterality Date  . CYST REMOVAL HAND Right 2005   Family History: family history includes Alcohol abuse in her father; Alcoholism in her father; Alzheimer's disease in her maternal grandfather; Anxiety disorder in her paternal grandmother; COPD in her mother; Cancer in her maternal grandmother, mother, and paternal grandfather; Diabetes in her mother; Drug abuse in her mother; Hypertension in her father; Ovarian cancer (age of onset: 50) in her mother. Social History:  reports that she quit smoking about 15 years ago. Her smoking use included cigarettes. She has never used smokeless tobacco. She reports previous alcohol use of about 7.0 standard drinks of alcohol per week. She reports that she does not use drugs.     Maternal Diabetes: No Genetic Screening: Normal Maternal Ultrasounds/Referrals: Normal Fetal Ultrasounds or other Referrals:  None Maternal Substance Abuse:  No Significant Maternal Medications:  None Significant Maternal Lab Results:  None Other Comments:  None  Review of Systems  Eyes: Positive for photophobia.   History Dilation: 10 Effacement (%): 100 Station: Plus 2 Exam by:: Conservation officer, nature  Blood pressure 119/79, pulse (!) 151, temperature 98.5 F (36.9 C), temperature source Oral, resp. rate 18, height 5\' 5"  (1.651 m), weight 85.8 kg, last menstrual period 10/29/2018. Exam Physical Exam  Prenatal labs: ABO, Rh:  --/--/B POS Performed at [facility], [address], [place], [place]  [phone] [id number]/07 0600) Antibody: NEG (07/07 0052) Rubella: Immune (12/11 0000) RPR: NON REACTIVE (07/07 0052)  HBsAg: Negative (12/11 0000)  HIV:   NR GBS: Negative/-- (06/17 0000)   Assessment/Plan: 1) Admit 2) Cytotec 3) AROM/Pit when able 4) Epidural on request   05-14-1974 07/29/2019, 10:39 PM

## 2019-07-29 NOTE — Plan of Care (Signed)

## 2019-07-29 NOTE — Anesthesia Procedure Notes (Signed)
Epidural Patient location during procedure: OB Start time: 07/29/2019 6:16 PM End time: 07/29/2019 6:28 PM  Staffing Anesthesiologist: Heather Roberts, MD Performed: anesthesiologist   Preanesthetic Checklist Completed: patient identified, IV checked, site marked, risks and benefits discussed, monitors and equipment checked, pre-op evaluation and timeout performed  Epidural Patient position: sitting Prep: DuraPrep Patient monitoring: heart rate, cardiac monitor, continuous pulse ox and blood pressure Approach: midline Location: L2-L3 Injection technique: LOR saline  Needle:  Needle type: Tuohy  Needle gauge: 17 G Needle length: 9 cm Needle insertion depth: 6 cm Catheter size: 20 Guage Catheter at skin depth: 11 cm Test dose: negative and Other  Assessment Events: blood not aspirated, injection not painful, no injection resistance and negative IV test  Additional Notes Informed consent obtained prior to proceeding including risk of failure, 1% risk of PDPH, risk of minor discomfort and bruising.  Discussed rare but serious complications including epidural abscess, permanent nerve injury, epidural hematoma.  Discussed alternatives to epidural analgesia and patient desires to proceed.  Timeout performed pre-procedure verifying patient name, procedure, and platelet count.  Patient tolerated procedure well.

## 2019-07-29 NOTE — Progress Notes (Signed)
I received a consult for creating or updating an advance directive.  I spoke with pt's nurse because pt was actively laboring. I will follow up after delivery.  If assistance is needed sooner, please page, [phone].  Chaplain Dyanne Carrel, Bcc Pager, [phone] 4:11 PM    07/29/19 1600  Clinical Encounter Type  Visited With Health care provider

## 2019-07-29 NOTE — Anesthesia Preprocedure Evaluation (Signed)
Anesthesia Evaluation  Patient identified by MRN, date of birth, ID band Patient awake    Reviewed: Allergy & Precautions, NPO status , Patient's Chart, lab work & pertinent test results  History of Anesthesia Complications Negative for: history of anesthetic complications  Airway Mallampati: II  TM Distance: >3 FB Neck ROM: Full    Dental no notable dental hx. (+) Dental Advisory Given   Pulmonary neg pulmonary ROS, former smoker,    Pulmonary exam normal        Cardiovascular negative cardio ROS Normal cardiovascular exam     Neuro/Psych  Headaches, negative psych ROS   GI/Hepatic negative GI ROS, Neg liver ROS,   Endo/Other  negative endocrine ROS  Renal/GU negative Renal ROS  negative genitourinary   Musculoskeletal negative musculoskeletal ROS (+)   Abdominal   Peds negative pediatric ROS (+)  Hematology negative hematology ROS (+)   Anesthesia Other Findings   Reproductive/Obstetrics (+) Pregnancy                             Anesthesia Physical Anesthesia Plan  ASA: II  Anesthesia Plan: Epidural   Post-op Pain Management:    Induction:   PONV Risk Score and Plan:   Airway Management Planned:   Additional Equipment:   Intra-op Plan:   Post-operative Plan:   Informed Consent: I have reviewed the patients History and Physical, chart, labs and discussed the procedure including the risks, benefits and alternatives for the proposed anesthesia with the patient or authorized representative who has indicated his/her understanding and acceptance.     Dental advisory given  Plan Discussed with: Anesthesiologist  Anesthesia Plan Comments:         Anesthesia Quick Evaluation

## 2019-07-30 LAB — CBC
HCT: 34.2 % — ABNORMAL LOW (ref 36.0–46.0)
Hemoglobin: 11.4 g/dL — ABNORMAL LOW (ref 12.0–15.0)
MCH: 28.7 pg (ref 26.0–34.0)
MCHC: 33.3 g/dL (ref 30.0–36.0)
MCV: 86.1 fL (ref 80.0–100.0)
Platelets: 200 10*3/uL (ref 150–400)
RBC: 3.97 MIL/uL (ref 3.87–5.11)
RDW: 16.4 % — ABNORMAL HIGH (ref 11.5–15.5)
WBC: 20.5 10*3/uL — ABNORMAL HIGH (ref 4.0–10.5)
nRBC: 0 % (ref 0.0–0.2)

## 2019-07-30 MED ORDER — ZOLPIDEM TARTRATE 5 MG PO TABS: 5.0000 mg | ORAL_TABLET | Freq: Every evening | ORAL | Status: DC | PRN

## 2019-07-30 MED ORDER — SIMETHICONE 80 MG PO CHEW: 80.0000 mg | CHEWABLE_TABLET | ORAL | Status: DC | PRN

## 2019-07-30 MED ORDER — METHYLERGONOVINE MALEATE 0.2 MG PO TABS: 0.2000 mg | ORAL_TABLET | ORAL | Status: DC | PRN

## 2019-07-30 MED ORDER — BENZOCAINE-MENTHOL 20-0.5 % EX AERO: 1.0000 "application " | INHALATION_SPRAY | CUTANEOUS | Status: DC | PRN

## 2019-07-30 MED ORDER — ACETAMINOPHEN 325 MG PO TABS
650.0000 mg | ORAL_TABLET | ORAL | Status: DC | PRN
  Administered 2019-07-30 – 2019-07-31 (×2): 650 mg via ORAL
  Filled 2019-07-30 (×2): qty 2

## 2019-07-30 MED ORDER — SENNOSIDES-DOCUSATE SODIUM 8.6-50 MG PO TABS
2.0000 | ORAL_TABLET | ORAL | Status: DC
  Administered 2019-07-30: 2 via ORAL
  Filled 2019-07-30: qty 2

## 2019-07-30 MED ORDER — DIPHENHYDRAMINE HCL 25 MG PO CAPS: 25.0000 mg | ORAL_CAPSULE | Freq: Four times a day (QID) | ORAL | Status: DC | PRN

## 2019-07-30 MED ORDER — COCONUT OIL OIL
1.0000 "application " | TOPICAL_OIL | Status: DC | PRN
  Administered 2019-07-31: 1 via TOPICAL

## 2019-07-30 MED ORDER — OXYCODONE HCL 5 MG PO TABS: 5.0000 mg | ORAL_TABLET | ORAL | Status: DC | PRN

## 2019-07-30 MED ORDER — ONDANSETRON HCL 4 MG PO TABS: 4.0000 mg | ORAL_TABLET | ORAL | Status: DC | PRN

## 2019-07-30 MED ORDER — IBUPROFEN 600 MG PO TABS
600.0000 mg | ORAL_TABLET | Freq: Four times a day (QID) | ORAL | Status: DC
  Administered 2019-07-30 – 2019-07-31 (×6): 600 mg via ORAL
  Filled 2019-07-30 (×6): qty 1

## 2019-07-30 MED ORDER — OXYCODONE HCL 5 MG PO TABS: 10.0000 mg | ORAL_TABLET | ORAL | Status: DC | PRN

## 2019-07-30 MED ORDER — DIBUCAINE (PERIANAL) 1 % EX OINT: 1.0000 "application " | TOPICAL_OINTMENT | CUTANEOUS | Status: DC | PRN

## 2019-07-30 MED ORDER — WITCH HAZEL-GLYCERIN EX PADS: 1.0000 "application " | MEDICATED_PAD | CUTANEOUS | Status: DC | PRN

## 2019-07-30 MED ORDER — ONDANSETRON HCL 4 MG/2ML IJ SOLN: 4.0000 mg | INTRAMUSCULAR | Status: DC | PRN

## 2019-07-30 MED ORDER — METHYLERGONOVINE MALEATE 0.2 MG/ML IJ SOLN: 0.2000 mg | INTRAMUSCULAR | Status: DC | PRN

## 2019-07-30 MED ORDER — TETANUS-DIPHTH-ACELL PERTUSSIS 5-2.5-18.5 LF-MCG/0.5 IM SUSP: 0.5000 mL | Freq: Once | INTRAMUSCULAR | Status: DC

## 2019-07-30 MED ORDER — PRENATAL MULTIVITAMIN CH
1.0000 | ORAL_TABLET | Freq: Every day | ORAL | Status: DC
  Administered 2019-07-30: 1 via ORAL
  Filled 2019-07-30 (×2): qty 1

## 2019-07-30 NOTE — Lactation Note (Signed)
This note was copied from a baby's chart. Lactation Consultation Note  Patient Name: Danielle Cannon [id number] Date: 07/30/2019 Reason for consult: Initial assessment   P3, First time breastfeeding.  Baby 11 hours old and called to room to assist w/ latching. Mother has chosen to breast and formula feed.  Baby received approx 10 ml of formula at 0245.   Reviewed hand expression prior to latching with drops expressed and given to baby to interest her in feeding. Undressed baby and attempted to latch and baby did not wake. Baby had medium emesis.   Mother plans to take nap and then will place baby STS until baby latches.  Feed on demand with cues.  Goal 8-12+ times per day after first 24 hrs.  Place baby STS if not cueing.  Mom made aware of O/P services, breastfeeding support groups, community resources, and our phone # for post-discharge questions.      Maternal Data Has patient been taught Hand Expression?: Yes Does the patient have breastfeeding experience prior to this delivery?: No  Feeding Feeding Type: Breast Fed  LATCH Score Latch: Repeated attempts needed to sustain latch, nipple held in mouth throughout feeding, stimulation needed to elicit sucking reflex.  Audible Swallowing: None  Type of Nipple: Everted at rest and after stimulation (short shafted)  Comfort (Breast/Nipple): Soft / non-tender  Hold (Positioning): Full assist, staff holds infant at breast  LATCH Score: 5  Interventions Interventions: Breast feeding basics reviewed  Lactation Tools Discussed/Used     Consult Status Consult Status: Follow-up Date: 07/31/19 Follow-up type: In-patient    Dahlia Byes [facility] 07/30/2019, 10:48 AM

## 2019-07-30 NOTE — Progress Notes (Signed)
Patient is eating, ambulating, voiding.  Pain control is good.  Vitals:   07/30/19 0035 07/30/19 0102 07/30/19 0200 07/30/19 0545  BP:  106/66 99/67 105/67  Pulse:  74 75 64  Resp:  18 18 18   Temp: 99.2 F (37.3 C) 99.1 F (37.3 C) 98.6 F (37 C) 98.3 F (36.8 C)  TempSrc: Oral Oral Oral   SpO2:   98% 100%  Weight:      Height:        Fundus firm Perineum without swelling.  Lab Results  Component Value Date   WBC 20.5 (H) 07/30/2019   HGB 11.4 (L) 07/30/2019   HCT 34.2 (L) 07/30/2019   MCV 86.1 07/30/2019   PLT 200 07/30/2019    --/--/B POS Performed at [facility], [address] [place], [place]  (07/07 0600)/[place]  A/P Post partum day 1.  Routine care.  Expect d/c tomorrow.    09-02-1980

## 2019-07-30 NOTE — Anesthesia Postprocedure Evaluation (Signed)
Anesthesia Post Note  Patient: Danielle Cannon  Procedure(s) Performed: AN AD HOC LABOR EPIDURAL     Patient location during evaluation: Mother Baby Anesthesia Type: Epidural Level of consciousness: awake and alert and oriented Pain management: satisfactory to patient Vital Signs Assessment: post-procedure vital signs reviewed and stable Respiratory status: spontaneous breathing and nonlabored ventilation Cardiovascular status: stable Postop Assessment: no headache, no backache, no signs of nausea or vomiting, adequate PO intake, patient able to bend at knees and able to ambulate (patient up walking) Anesthetic complications: no   No complications documented.  Last Vitals:  Vitals:   07/30/19 0545 07/30/19 0921  BP: 105/67 91/63  Pulse: 64 70  Resp: 18 18  Temp: 36.8 C 36.5 C  SpO2: 100% 100%    Last Pain:  Vitals:   07/30/19 0921  TempSrc: Oral  PainSc: 0-No pain   Pain Goal:                Epidural/Spinal Function Cutaneous sensation: Normal sensation (07/30/19 0921)  Madison Hickman

## 2019-07-30 NOTE — Progress Notes (Signed)
MOB was referred for history of anxiety.  * Referral screened out by Clinical Social Worker because none of the following criteria appear to apply:  ~ History of anxiety/depression during this pregnancy, or of post-partum depression following prior delivery. CSW unable to find documented history of anxiety nor were there concerns noted in [facility] records. ~ Diagnosis of anxiety and/or depression within last 3 years OR * MOB's symptoms currently being treated with medication and/or therapy.  Please contact the Clinical Social Worker if needs arise, by [facility] request, or if MOB scores greater than 9/yes to question 10 on Edinburgh Postpartum Depression Screen.  Lear Ng, LCSW [facility] [phone]

## 2019-07-31 NOTE — Progress Notes (Signed)
I received a consult that pt wishes to create or update advance directives.  I inquired about pt's desire to fill out an advance directive at this time.  She stated that she was not interested and didn't mean to answer "yes" to that admission question.  She is preparing to discharge home and is looking forward to bringing baby home to her big sisters (ages 36 and 56).  Chaplain Dyanne Carrel, Bcc Pager, [phone] 11:37 AM

## 2019-07-31 NOTE — Discharge Summary (Signed)
     Postpartum Discharge Summary     Patient Name: Danielle Cannon DOB: Oct 16, 1975 MRN: [id number]  Date of admission: 07/29/2019 Delivery date:07/29/2019  Delivering provider: Waynard Reeds  Date of discharge: 07/31/2019  Admitting diagnosis: AMA (advanced maternal age) multigravida 35+ [O09.529] Spontaneous vaginal delivery [O80] Intrauterine pregnancy: [redacted]w[redacted]d       Discharge diagnosis: Term Pregnancy Delivered and [facility] course: Induction of Labor With Vaginal Delivery   44 y.o. yo [id number] at [redacted]w[redacted]d was admitted to the hospital 07/29/2019 for induction of labor.  Indication for induction: AMA.  Patient had an uncomplicated labor course as follows: Membrane Rupture Time/Date: 9:20 AM ,07/29/2019   Delivery Method:Vaginal, Spontaneous  Episiotomy: None  Lacerations:  None  Details of delivery can be found in separate delivery note.  Patient had a routine postpartum course. Patient is discharged home 07/31/19.  Newborn Data: Birth date:07/29/2019  Birth time:10:49 PM  Gender:Female  Living status:Living  Apgars:9 ,9  Weight:3099 g    Vitals:   07/30/19 0921 07/30/19 1351 07/30/19 2132 07/31/19 0556  BP: 91/63 104/69 96/66 106/79  Pulse: 70 70 70 62  Resp: 18 18 19 18   Temp: 97.7 F (36.5 C) 98.1 F (36.7 C) 98 F (36.7 C) 98.3 F (36.8 C)  TempSrc: Oral Oral Oral Axillary  SpO2: 100%  100% 100%  Weight:      Height:       General: alert Lochia: appropriate Labs: Lab Results  Component Value Date   WBC 20.5 (H) 07/30/2019   HGB 11.4 (L) 07/30/2019   HCT 34.2 (L) 07/30/2019   MCV 86.1 07/30/2019   PLT 200 07/30/2019   CMP Latest Ref Rng & Units 12/03/2018  Glucose 70 - 99 mg/dL 13/11/2018)  BUN 6 - 20 mg/dL 8  Creatinine 998(P - 3.82 mg/dL 5.05  Sodium 3.97 - 673 mmol/L 135  Potassium 3.5 - 5.1 mmol/L 3.5  Chloride 98 - 111 mmol/L 104  CO2 22 - 32 mmol/L 22  Calcium 8.9 - 10.3 mg/dL 9.2  Total Protein 6.0 - 8.3 g/dL -  Total  Bilirubin 0.2 - 1.2 mg/dL -  Alkaline Phos 39 - 419 U/L -  AST 0 - 37 U/L -  ALT 0 - 35 U/L -       After visit meds:  Allergies as of 07/31/2019   No Known Allergies     Medication List    STOP taking these medications   acetaminophen 500 MG tablet Commonly known as: TYLENOL   ZINC PO     TAKE these medications   PRENATAL PO Take 1 tablet by mouth daily.        Discharge home in stable condition  Infant Disposition:home with mother Discharge instruction: per After Visit Summary and Postpartum booklet. Activity: Advance as tolerated. Pelvic rest for 6 weeks.  Diet: routine diet   Follow-up Information    10/01/2019, MD. Schedule an appointment as soon as possible for a visit in 1 month(s).   Specialty: Obstetrics and Gynecology Contact information: [address] [place] [phone]                   07/31/2019 10/01/2019, MD

## 2019-07-31 NOTE — Progress Notes (Signed)
PPD#2 Pt states that she is doing well She would like to go home VSSAF IMP/ Stable Plan/ Will discharge

## 2019-07-31 NOTE — Lactation Note (Signed)
This note was copied from a baby's chart. Lactation Consultation Note  Patient Name: Danielle Cannon [id number] Date: 07/31/2019 Reason for consult: Follow-up assessment Baby is 34 hours old/2% weight loss.  Mom mostly formula feeding.  She states baby latched twice yesterday.  Baby just finished a feeding.  Offered latch assist prior to discharge.  Recommended mom begin pumping every 3 hours for 15-20 minutes to establish a good milk supply.  Reviewed outpatient services and encouraged to call prn.  Maternal Data    Feeding    LATCH Score                   Interventions    Lactation Tools Discussed/Used     Consult Status Consult Status: Complete Follow-up type: Call as needed    Huston Foley 07/31/2019, 9:07 AM

## 2019-08-26 DIAGNOSIS — Z1331 Encounter for screening for depression: Secondary | ICD-10-CM | POA: Diagnosis not present

## 2019-09-03 ENCOUNTER — Other Ambulatory Visit: Payer: Self-pay | Admitting: Obstetrics & Gynecology

## 2019-09-03 DIAGNOSIS — N841 Polyp of cervix uteri: Secondary | ICD-10-CM

## 2019-09-03 DIAGNOSIS — Z8041 Family history of malignant neoplasm of ovary: Secondary | ICD-10-CM

## 2019-12-25 DIAGNOSIS — Z302 Encounter for sterilization: Secondary | ICD-10-CM | POA: Diagnosis not present

## 2021-02-11 IMAGING — MG DIGITAL DIAGNOSTIC UNILATERAL RIGHT MAMMOGRAM WITH TOMO AND CAD
4 series · 4 of 12 positions shown · non-contrast
Comparison: Baseline screening mammogram 03/20/2018

CLINICAL DATA: Screening recall from baseline for a possible right
breast mass. The patient has family history of ovarian cancer in her
mother, and has been otherwise genetically tested and was negative
for gene mutations. She is unaware of any breast cancers in her
family.

EXAM:
DIGITAL DIAGNOSTIC RIGHT MAMMOGRAM WITH CAD AND TOMO
ULTRASOUND RIGHT BREAST

[R MLO synth-2D]
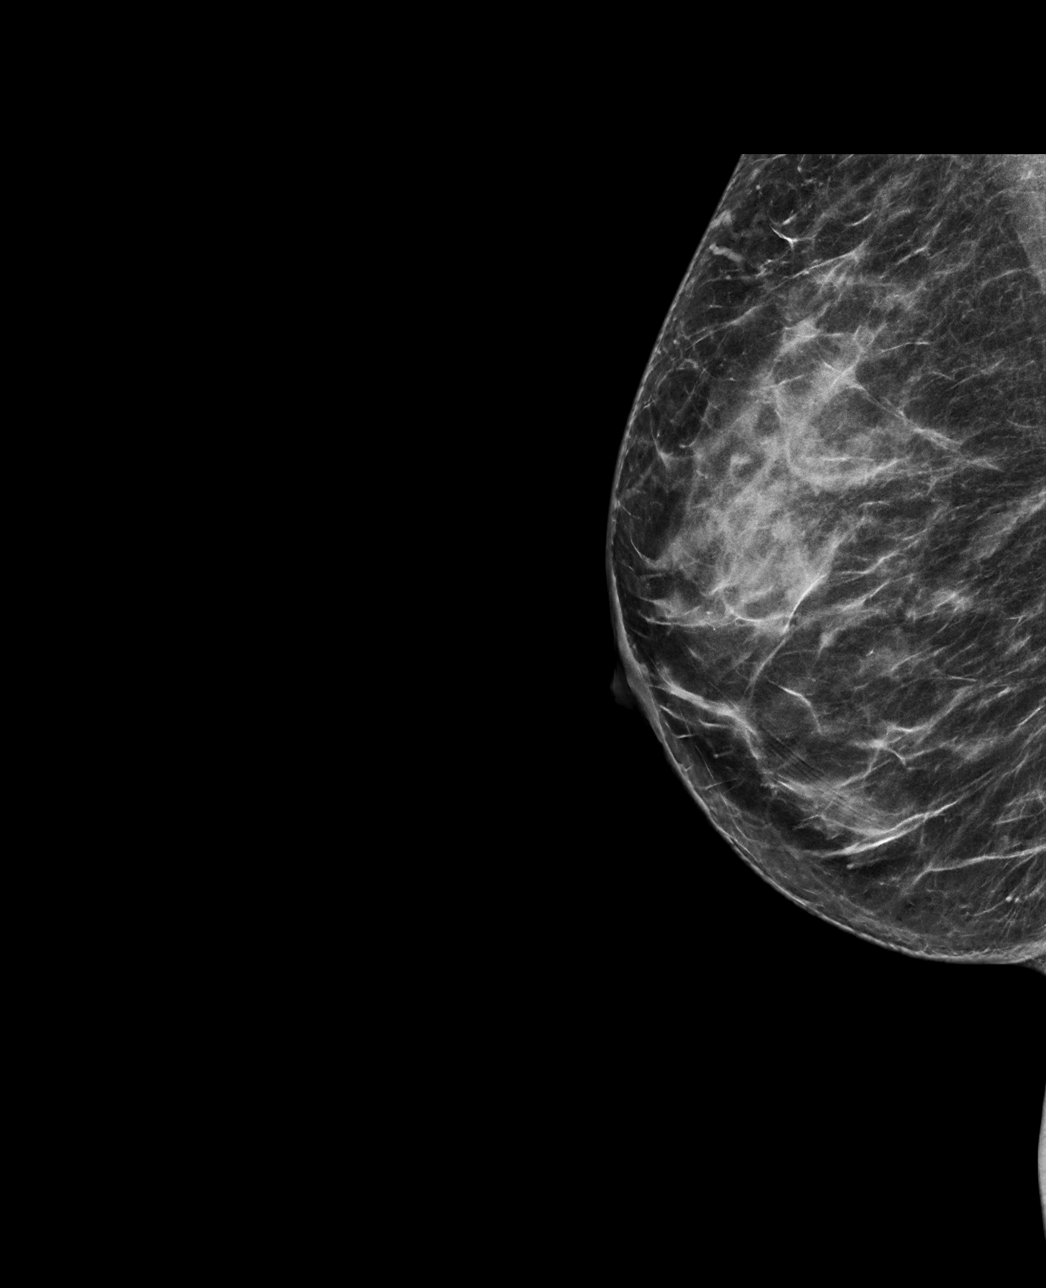

[R CC synth-2D]
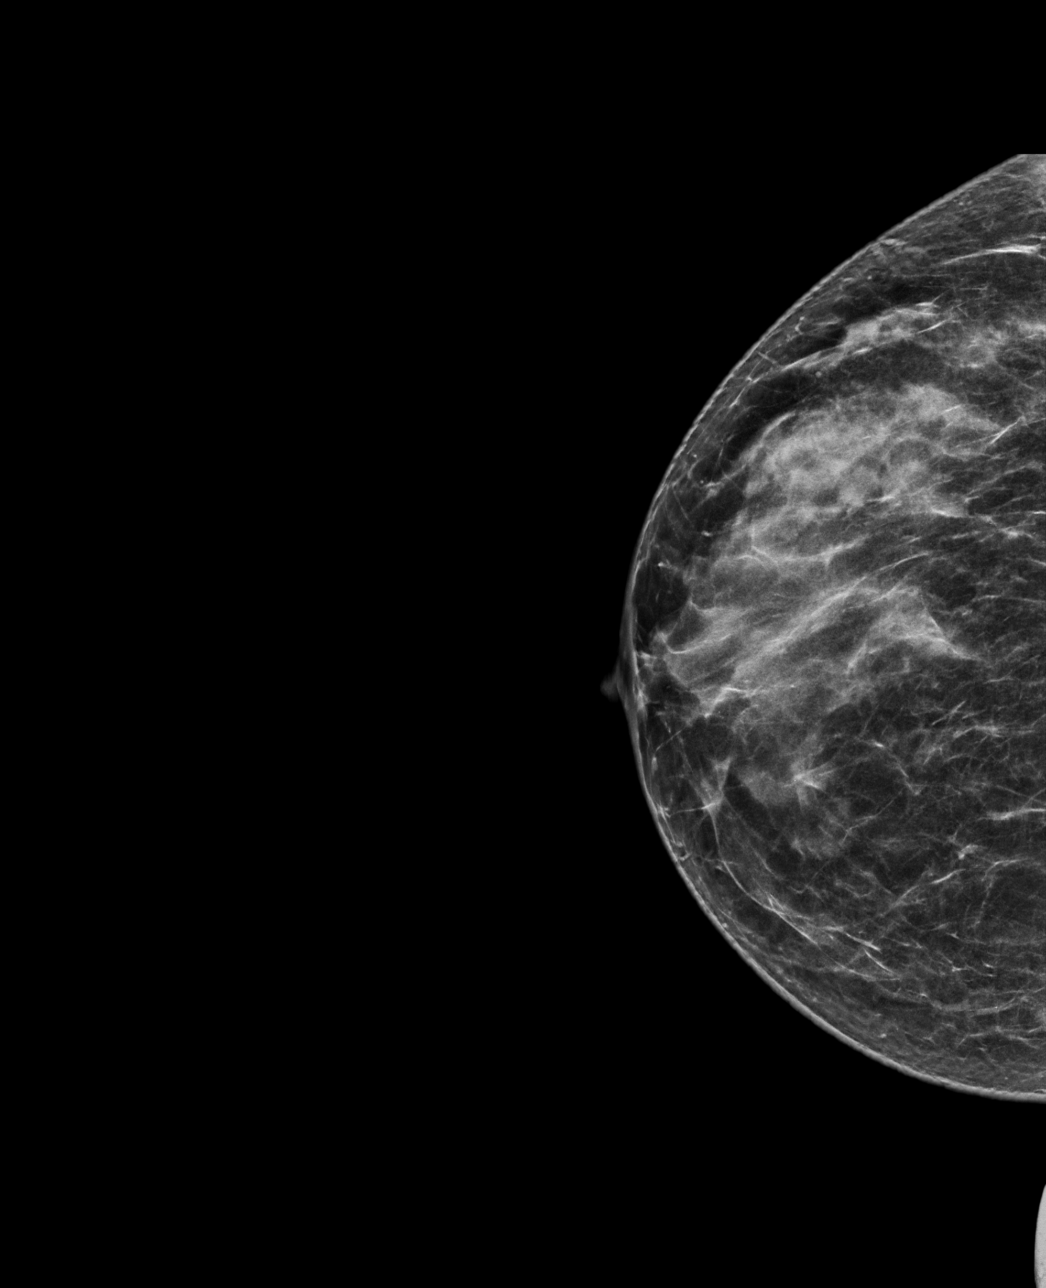

[R CC tomo · tomo slice 33/64.0]
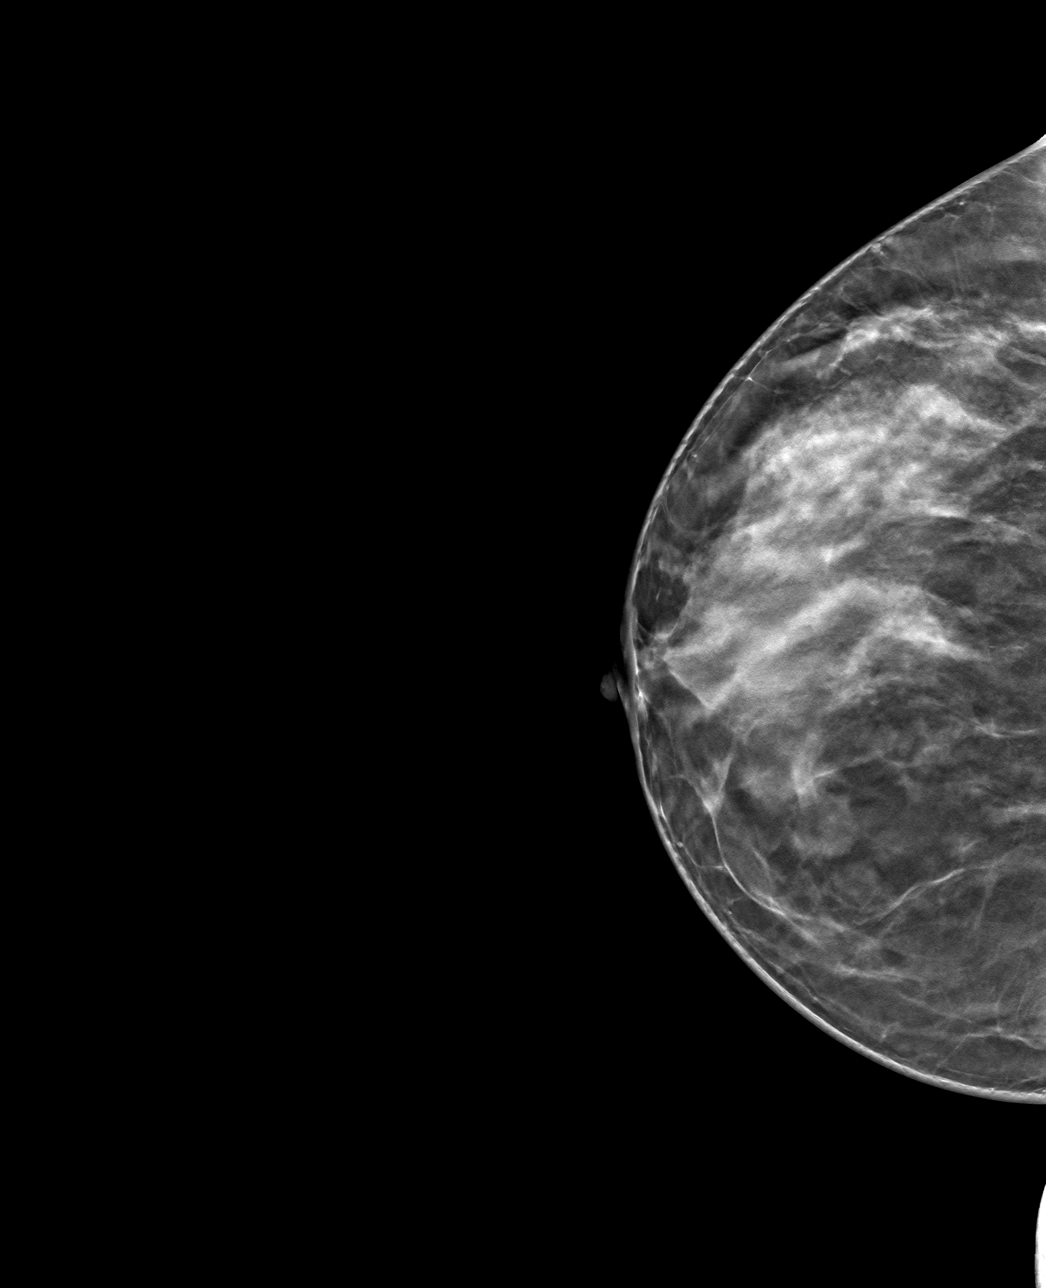

[R MLO tomo · tomo slice 35/68.0]
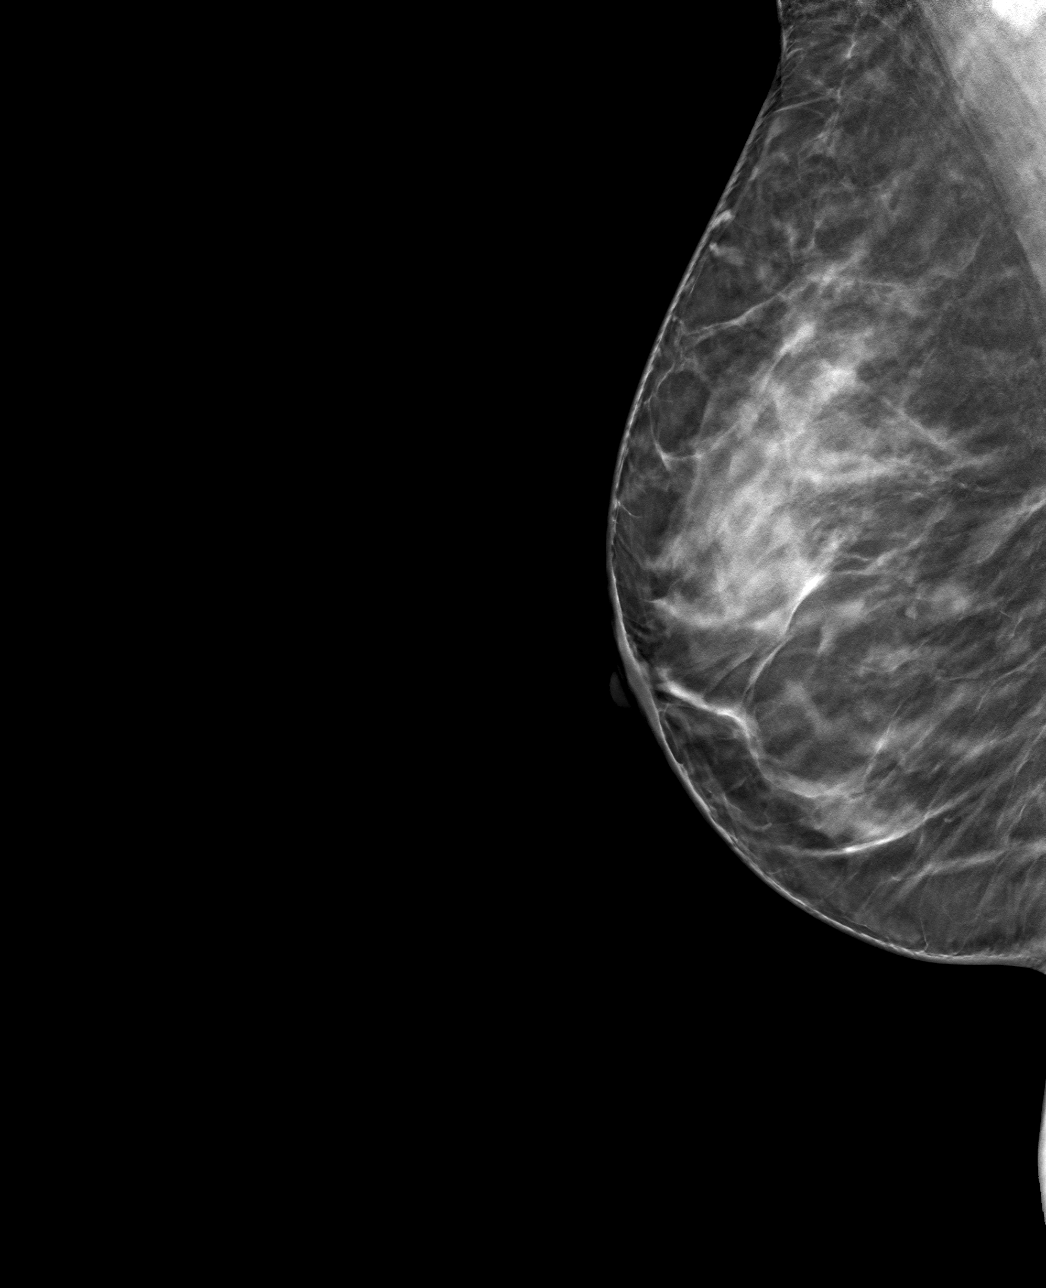

[4 of 12 positions shown; findings below may reference images not displayed]

ACR Breast Density Category c: The breast tissue is heterogeneously
dense, which may obscure small masses.
FINDINGS: In the central to lateral aspect of the right breast, far posterior
depth, there is a circumscribed low-density mass measuring
approximately 1.3 cm.

Mammographic images were processed with CAD.

Ultrasound of the right breast at 9 o'clock, 5 cm from the nipple
demonstrates an anechoic oval circumscribed mass measuring 1.4 x
x 1.1 cm.
IMPRESSION: The mass in the right breast corresponds with a benign cyst.

RECOMMENDATION:
1.  Screening mammogram in one year.(Code:[id number])

2. Considering the family history of ovarian cancer in her mother,
and multiple other cancers in other family members (no breast as to
patient is aware of), consider genetics counseling/testing.

I have discussed the findings and recommendations with the patient.
Results were also provided in writing at the conclusion of the
visit. If applicable, a reminder letter will be sent to the patient
regarding the next appointment.

BI-RADS CATEGORY  2: Benign.

## 2021-02-11 IMAGING — US ULTRASOUND RIGHT BREAST LIMITED
1 series · 5 of 5 positions shown · non-contrast
Comparison: Baseline screening mammogram 03/20/2018

CLINICAL DATA: Screening recall from baseline for a possible right
breast mass. The patient has family history of ovarian cancer in her
mother, and has been otherwise genetically tested and was negative
for gene mutations. She is unaware of any breast cancers in her
family.

EXAM:
DIGITAL DIAGNOSTIC RIGHT MAMMOGRAM WITH CAD AND TOMO
ULTRASOUND RIGHT BREAST

[Series 1: ultrasound right breast limited · 0.06mm/px · 5 of 5 slices shown]
[im 1/5]
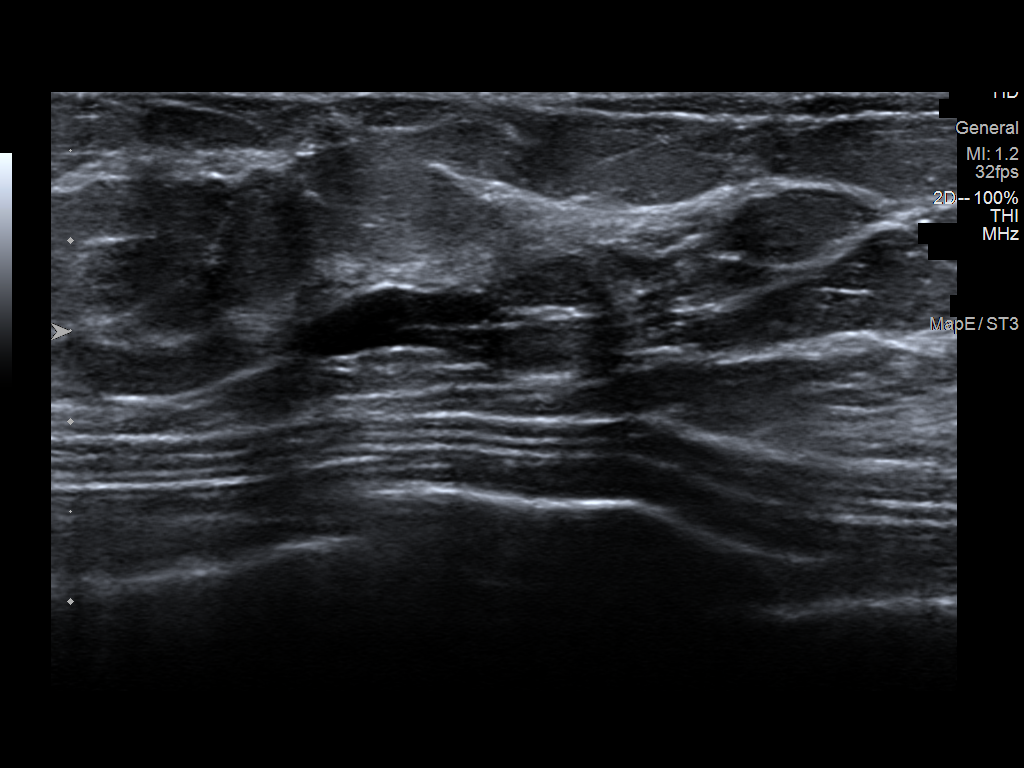
[im 2/5]
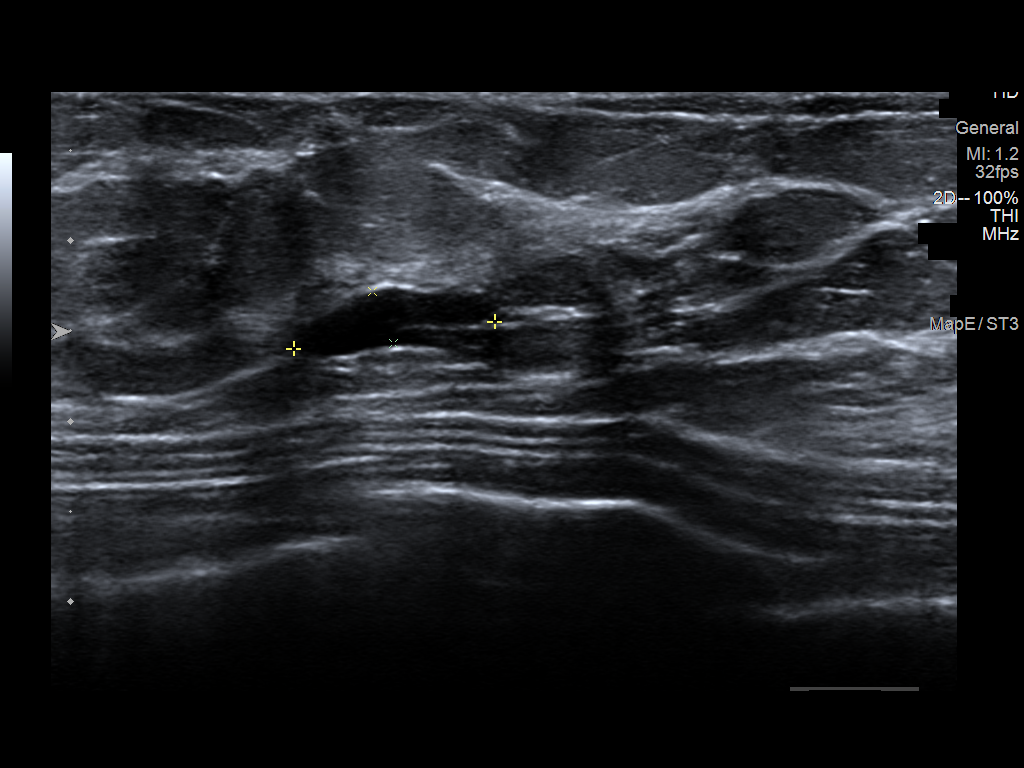
[im 3/5]
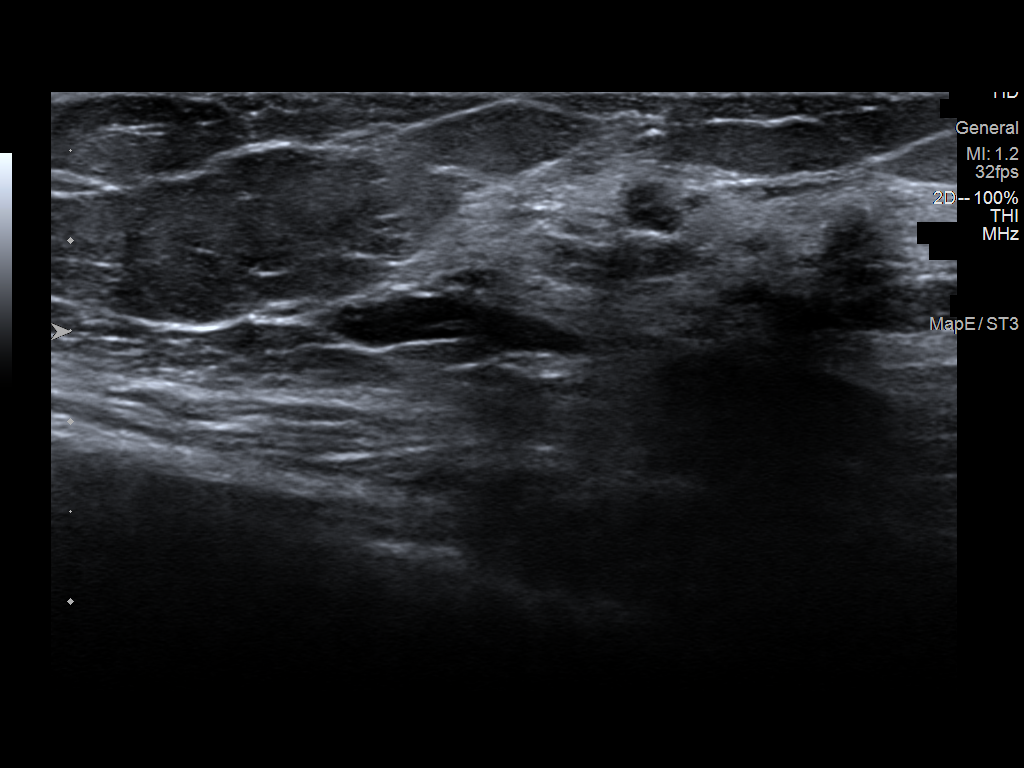
[im 4/5]
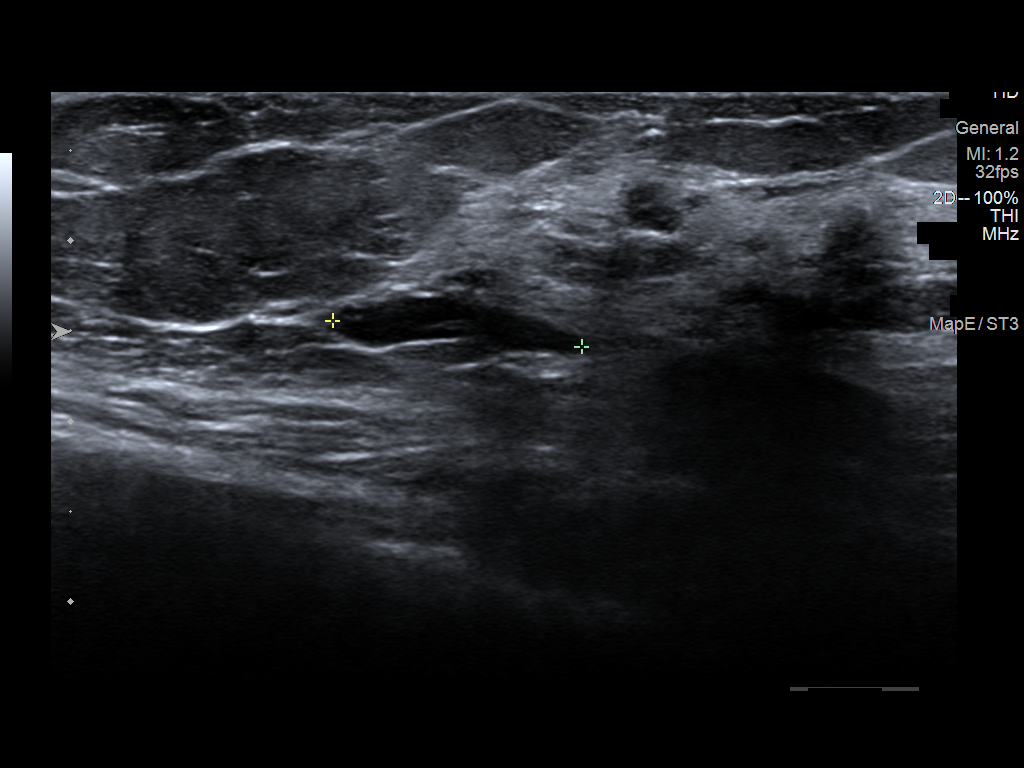
[im 5/5]
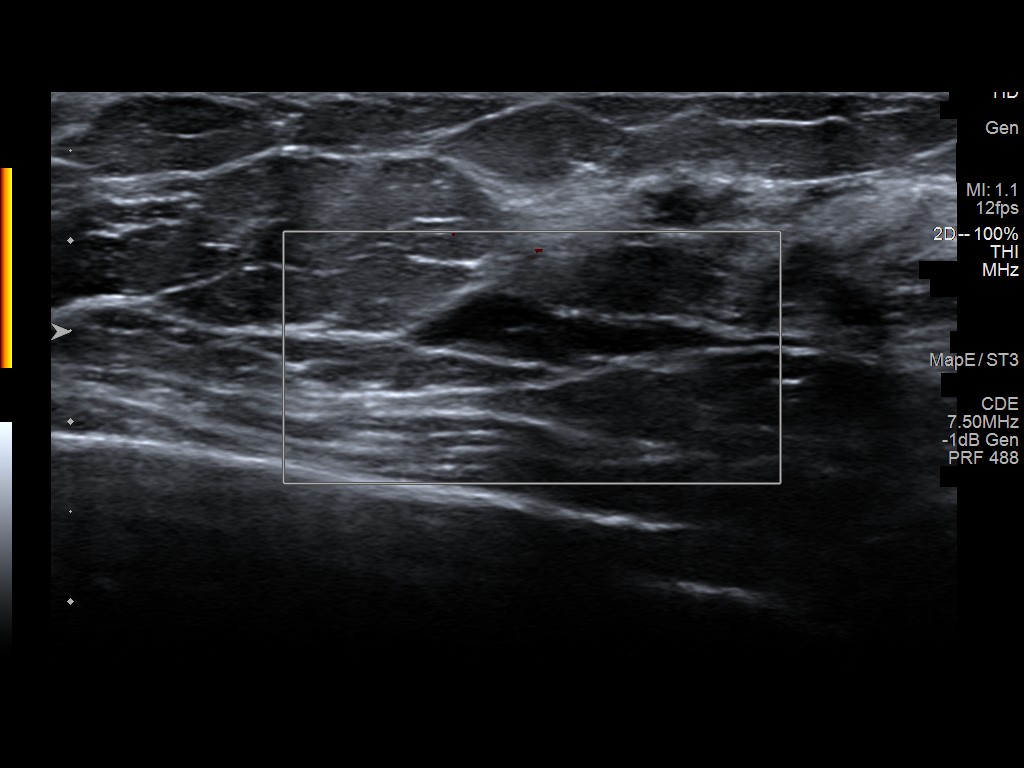

[5 of 5 positions shown; findings below may reference images not displayed]

ACR Breast Density Category c: The breast tissue is heterogeneously
dense, which may obscure small masses.
FINDINGS: In the central to lateral aspect of the right breast, far posterior
depth, there is a circumscribed low-density mass measuring
approximately 1.3 cm.

Mammographic images were processed with CAD.

Ultrasound of the right breast at 9 o'clock, 5 cm from the nipple
demonstrates an anechoic oval circumscribed mass measuring 1.4 x
x 1.1 cm.
IMPRESSION: The mass in the right breast corresponds with a benign cyst.

RECOMMENDATION:
1.  Screening mammogram in one year.(Code:[id number])

2. Considering the family history of ovarian cancer in her mother,
and multiple other cancers in other family members (no breast as to
patient is aware of), consider genetics counseling/testing.

I have discussed the findings and recommendations with the patient.
Results were also provided in writing at the conclusion of the
visit. If applicable, a reminder letter will be sent to the patient
regarding the next appointment.

BI-RADS CATEGORY  2: Benign.

## 2021-09-19 ENCOUNTER — Encounter: Payer: Medicaid Other | Admitting: Family Medicine

## 2021-09-28 DIAGNOSIS — O26841 Uterine size-date discrepancy, first trimester: Secondary | ICD-10-CM | POA: Diagnosis not present

## 2021-09-28 DIAGNOSIS — O09521 Supervision of elderly multigravida, first trimester: Secondary | ICD-10-CM | POA: Diagnosis not present

## 2021-09-28 DIAGNOSIS — Z3480 Encounter for supervision of other normal pregnancy, unspecified trimester: Secondary | ICD-10-CM | POA: Diagnosis not present

## 2021-09-28 DIAGNOSIS — Z113 Encounter for screening for infections with a predominantly sexual mode of transmission: Secondary | ICD-10-CM | POA: Diagnosis not present

## 2021-10-31 DIAGNOSIS — R7309 Other abnormal glucose: Secondary | ICD-10-CM | POA: Diagnosis not present

## 2021-10-31 DIAGNOSIS — Z3482 Encounter for supervision of other normal pregnancy, second trimester: Secondary | ICD-10-CM | POA: Diagnosis not present

## 2021-11-21 DIAGNOSIS — O99810 Abnormal glucose complicating pregnancy: Secondary | ICD-10-CM | POA: Diagnosis not present

## 2021-11-27 DIAGNOSIS — Z363 Encounter for antenatal screening for malformations: Secondary | ICD-10-CM | POA: Diagnosis not present

## 2021-11-27 DIAGNOSIS — Z3A20 20 weeks gestation of pregnancy: Secondary | ICD-10-CM | POA: Diagnosis not present

## 2021-12-06 ENCOUNTER — Encounter: Payer: Medicaid Other | Attending: Obstetrics and Gynecology | Admitting: Registered"

## 2021-12-06 DIAGNOSIS — O24419 Gestational diabetes mellitus in pregnancy, unspecified control: Secondary | ICD-10-CM | POA: Diagnosis not present

## 2021-12-06 NOTE — Progress Notes (Signed)
Patient was seen on 12/06/21 for Gestational Diabetes self-management class at the [facility]. The following learning objectives were met by the patient during this course:  States the definition of Gestational Diabetes States why dietary management is important in controlling blood glucose Describes the effects each nutrient has on blood glucose levels Demonstrates ability to create a balanced meal plan Demonstrates carbohydrate counting  States when to check blood glucose levels Demonstrates proper blood glucose monitoring techniques States the effect of stress and exercise on blood glucose levels States the importance of limiting caffeine and abstaining from alcohol and smoking  Blood glucose monitor given: Accu-chek Guide Me Lot #[id number] Exp: 2022-09-13 CBG: 132 mg/dL  Patient instructed to monitor glucose levels: FBS: 60 - <95; 1 hour: <140; 2 hour: <120  Patient received handouts: Nutrition Diabetes and Pregnancy, including carb counting list  Patient will be seen for follow-up as needed.

## 2022-01-18 DIAGNOSIS — R051 Acute cough: Secondary | ICD-10-CM | POA: Diagnosis not present

## 2022-01-18 DIAGNOSIS — J069 Acute upper respiratory infection, unspecified: Secondary | ICD-10-CM | POA: Diagnosis not present

## 2022-01-22 HISTORY — PX: BILATERAL SALPINGECTOMY: SHX5743

## 2022-01-25 DIAGNOSIS — J029 Acute pharyngitis, unspecified: Secondary | ICD-10-CM | POA: Diagnosis not present

## 2022-01-25 DIAGNOSIS — Z20822 Contact with and (suspected) exposure to covid-19: Secondary | ICD-10-CM | POA: Diagnosis not present

## 2022-01-25 DIAGNOSIS — R509 Fever, unspecified: Secondary | ICD-10-CM | POA: Diagnosis not present

## 2022-01-31 DIAGNOSIS — Z3A29 29 weeks gestation of pregnancy: Secondary | ICD-10-CM | POA: Diagnosis not present

## 2022-01-31 DIAGNOSIS — Z3480 Encounter for supervision of other normal pregnancy, unspecified trimester: Secondary | ICD-10-CM | POA: Diagnosis not present

## 2022-02-15 DIAGNOSIS — O09523 Supervision of elderly multigravida, third trimester: Secondary | ICD-10-CM | POA: Diagnosis not present

## 2022-02-15 DIAGNOSIS — O09513 Supervision of elderly primigravida, third trimester: Secondary | ICD-10-CM | POA: Diagnosis not present

## 2022-02-15 DIAGNOSIS — Z3A31 31 weeks gestation of pregnancy: Secondary | ICD-10-CM | POA: Diagnosis not present

## 2022-02-27 DIAGNOSIS — O09523 Supervision of elderly multigravida, third trimester: Secondary | ICD-10-CM | POA: Diagnosis not present

## 2022-02-27 DIAGNOSIS — O24410 Gestational diabetes mellitus in pregnancy, diet controlled: Secondary | ICD-10-CM | POA: Diagnosis not present

## 2022-03-06 DIAGNOSIS — O09523 Supervision of elderly multigravida, third trimester: Secondary | ICD-10-CM | POA: Diagnosis not present

## 2022-03-06 DIAGNOSIS — O24410 Gestational diabetes mellitus in pregnancy, diet controlled: Secondary | ICD-10-CM | POA: Diagnosis not present

## 2022-03-13 DIAGNOSIS — O0993 Supervision of high risk pregnancy, unspecified, third trimester: Secondary | ICD-10-CM | POA: Diagnosis not present

## 2022-03-13 DIAGNOSIS — O09523 Supervision of elderly multigravida, third trimester: Secondary | ICD-10-CM | POA: Diagnosis not present

## 2022-03-13 DIAGNOSIS — Z3689 Encounter for other specified antenatal screening: Secondary | ICD-10-CM | POA: Diagnosis not present

## 2022-03-13 DIAGNOSIS — O24410 Gestational diabetes mellitus in pregnancy, diet controlled: Secondary | ICD-10-CM | POA: Diagnosis not present

## 2022-03-22 DIAGNOSIS — O0993 Supervision of high risk pregnancy, unspecified, third trimester: Secondary | ICD-10-CM | POA: Diagnosis not present

## 2022-03-22 DIAGNOSIS — O24410 Gestational diabetes mellitus in pregnancy, diet controlled: Secondary | ICD-10-CM | POA: Diagnosis not present

## 2022-03-22 DIAGNOSIS — Z3A37 37 weeks gestation of pregnancy: Secondary | ICD-10-CM | POA: Diagnosis not present

## 2022-03-22 DIAGNOSIS — O09523 Supervision of elderly multigravida, third trimester: Secondary | ICD-10-CM | POA: Diagnosis not present

## 2022-03-29 DIAGNOSIS — O0993 Supervision of high risk pregnancy, unspecified, third trimester: Secondary | ICD-10-CM | POA: Diagnosis not present

## 2022-03-29 DIAGNOSIS — O09523 Supervision of elderly multigravida, third trimester: Secondary | ICD-10-CM | POA: Diagnosis not present

## 2022-04-01 DIAGNOSIS — Z3A39 39 weeks gestation of pregnancy: Secondary | ICD-10-CM | POA: Diagnosis not present

## 2022-04-01 DIAGNOSIS — Z3483 Encounter for supervision of other normal pregnancy, third trimester: Secondary | ICD-10-CM | POA: Diagnosis not present

## 2022-04-01 DIAGNOSIS — O99824 Streptococcus B carrier state complicating childbirth: Secondary | ICD-10-CM | POA: Diagnosis not present

## 2022-04-01 DIAGNOSIS — O24420 Gestational diabetes mellitus in childbirth, diet controlled: Secondary | ICD-10-CM | POA: Diagnosis not present

## 2022-04-01 DIAGNOSIS — O99214 Obesity complicating childbirth: Secondary | ICD-10-CM | POA: Diagnosis not present

## 2022-04-02 DIAGNOSIS — O09523 Supervision of elderly multigravida, third trimester: Secondary | ICD-10-CM | POA: Diagnosis not present

## 2022-04-02 DIAGNOSIS — Z3A39 39 weeks gestation of pregnancy: Secondary | ICD-10-CM | POA: Diagnosis not present

## 2022-04-16 DIAGNOSIS — Z3009 Encounter for other general counseling and advice on contraception: Secondary | ICD-10-CM | POA: Diagnosis not present

## 2022-06-01 DIAGNOSIS — Z302 Encounter for sterilization: Secondary | ICD-10-CM | POA: Diagnosis not present

## 2022-06-02 DIAGNOSIS — Z302 Encounter for sterilization: Secondary | ICD-10-CM | POA: Diagnosis not present

## 2022-07-03 DIAGNOSIS — Z09 Encounter for follow-up examination after completed treatment for conditions other than malignant neoplasm: Secondary | ICD-10-CM | POA: Diagnosis not present

## 2023-01-09 ENCOUNTER — Encounter (HOSPITAL_BASED_OUTPATIENT_CLINIC_OR_DEPARTMENT_OTHER): Payer: Medicaid Other | Admitting: Certified Nurse Midwife

## 2023-01-29 ENCOUNTER — Encounter (HOSPITAL_BASED_OUTPATIENT_CLINIC_OR_DEPARTMENT_OTHER): Payer: Self-pay | Admitting: Certified Nurse Midwife

## 2023-01-30 ENCOUNTER — Ambulatory Visit (HOSPITAL_BASED_OUTPATIENT_CLINIC_OR_DEPARTMENT_OTHER): Payer: Medicaid Other | Admitting: Certified Nurse Midwife

## 2023-01-30 ENCOUNTER — Encounter (HOSPITAL_BASED_OUTPATIENT_CLINIC_OR_DEPARTMENT_OTHER): Payer: Self-pay | Admitting: Certified Nurse Midwife

## 2023-01-30 ENCOUNTER — Other Ambulatory Visit (HOSPITAL_COMMUNITY)
Admission: RE | Admit: 2023-01-30 | Discharge: 2023-01-30 | Disposition: A | Discharge status: Home / Self Care | Payer: 59 | Source: Ambulatory Visit | Attending: Certified Nurse Midwife | Admitting: Certified Nurse Midwife

## 2023-01-30 VITALS — BP 125/79 | HR 63 | Ht 65.0 in | Wt 188.6 lb

## 2023-01-30 DIAGNOSIS — Z6831 Body mass index (BMI) 31.0-31.9, adult: Secondary | ICD-10-CM | POA: Diagnosis not present

## 2023-01-30 DIAGNOSIS — Z01411 Encounter for gynecological examination (general) (routine) with abnormal findings: Secondary | ICD-10-CM

## 2023-01-30 DIAGNOSIS — Z8632 Personal history of gestational diabetes: Secondary | ICD-10-CM

## 2023-01-30 DIAGNOSIS — N920 Excessive and frequent menstruation with regular cycle: Secondary | ICD-10-CM

## 2023-01-30 DIAGNOSIS — Z124 Encounter for screening for malignant neoplasm of cervix: Secondary | ICD-10-CM | POA: Insufficient documentation

## 2023-01-30 NOTE — Progress Notes (Signed)
 48 y.o. [id number] Married Black or African American female here for annual exam.  She lives with her spouse. Her 2 youngest daughters are 61 months and 39years of age. She has 2 daughters in their 42s and a 4yo grand-son. Pt works full-time. Reports regular monthly periods that can be heavy. Hx GDM in prior pregnancy. Pt states she currently has Pregnancy Medicaid which will expire in 2 months. She will then get private insurance again. She isn't sure if pregnancy medicaid will cover mammograms, but plans annual screening mammograms once private insurance restarts. She reports that her Mother passed due to Ovarian cancer. She has a sister with Cervical Cancer.   Patient's last menstrual period was 01/09/2023.          Sexually active: Yes.    The current method of family planning is bil salpingectomy.     Upstream - 01/29/23 1448       Pregnancy Intention Screening   Does the patient want to become pregnant in the next year? No    Does the patient's partner want to become pregnant in the next year? No    Would the patient like to discuss contraceptive options today? No            Exercising: Yes.     Smoker:  no  Health Maintenance: Pap:  Collected History of abnormal Pap:  no MMG:  ordered Colonoscopy:  Discuss with PCP, referral placed to establish care with PCP Screening Labs: ordered    reports that she quit smoking about 18 years ago. Her smoking use included cigarettes. She has never used smokeless tobacco. She reports that she does not currently use alcohol after a past usage of about 7.0 standard drinks of alcohol per week. She reports that she does not use drugs.  Past Medical History:  Diagnosis Date   Headache    History of chicken pox 1990    Past Surgical History:  Procedure Laterality Date   CYST REMOVAL HAND Right 2005    Current Outpatient Medications  Medication Sig Dispense Refill   Prenatal Vit-Fe Fumarate-FA (PRENATAL PO) Take 1 tablet by mouth daily.  (Patient not taking: Reported on 01/29/2023)     No current facility-administered medications for this visit.    Family History  Problem Relation Age of Onset   Drug abuse Mother    Diabetes Mother    Ovarian cancer Mother 77   Cancer Mother    COPD Mother    Alcoholism Father    Hypertension Father    Alcohol abuse Father    Cancer Maternal Grandmother        lymphoma   Alzheimer's disease Maternal Grandfather    Anxiety disorder Paternal Grandmother    Cancer Paternal Grandfather        throat    ROS: Constitutional: negative Genitourinary:negative  Exam:   BP 125/79 (BP Location: Left Arm, Patient Position: Sitting, Cuff Size: Normal)   Pulse 63   Ht 5' 5 (1.651 m)   Wt 188 lb 9.6 oz (85.5 kg)   LMP 01/09/2023   BMI 31.38 kg/m   Height: 5' 5 (165.1 cm)  General appearance: alert, cooperative and appears stated age Head: Normocephalic, without obvious abnormality, atraumatic Neck: no adenopathy, supple, symmetrical, trachea midline and thyroid  normal to inspection and palpation Lungs: clear to auscultation bilaterally Breasts: normal appearance, no masses or tenderness, Inspection negative, No nipple retraction or dimpling, No nipple discharge or bleeding, No axillary or supraclavicular adenopathy, Normal to palpation without dominant  masses Heart: regular rate and rhythm Abdomen: soft, non-tender; bowel sounds normal; no masses,  no organomegaly Extremities: extremities normal, atraumatic, no cyanosis or edema Skin: Skin color, texture, turgor normal. No rashes or lesions Lymph nodes: Cervical, supraclavicular, and axillary nodes normal. No abnormal inguinal nodes palpated Neurologic: Grossly normal   Pelvic: External genitalia:  no lesions              Urethra:  normal appearing urethra with no masses, tenderness or lesions              Bartholins and Skenes: normal                 Vagina: normal appearing vagina with normal color and no discharge, no  lesions              Cervix: multiparous appearance, no bleeding following Pap, no cervical motion tenderness, and no lesions              Pap taken: Yes.   Bimanual Exam:  Uterus:  normal size, contour, position, consistency, mobility, non-tender              Adnexa: normal adnexa               Rectovaginal: Confirms               Anus:  normal sphincter tone, no lesions  Chaperone, Tempie Chancy, CMA, was present for exam.  Assessment/Plan: 1. BMI 31.0-31.9,adult (Primary) - Lipid panel - Hemoglobin A1c - TSH - CBC - Comp Met (CMET)  2. History of gestational diabetes - Hemoglobin A1c  3. Menorrhagia with regular cycle - Pt aware that hormonal contraception available for menorrhagia including IUD - TSH - CBC - Comp Met (CMET)  4. Cervical cancer screening - Cytology - PAP( [place])  5. Encounter for gynecological examination with abnormal finding - (Menorrhagia)  Pt plans annual screening mammograms. Will establish care with Primary Care Provider. Discuss colonoscopy with PCP. Arland MARLA Roller

## 2023-01-31 LAB — CBC
Hematocrit: 42.2 % (ref 34.0–46.6)
Hemoglobin: 14.3 g/dL (ref 11.1–15.9)
MCH: 31 pg (ref 26.6–33.0)
MCHC: 33.9 g/dL (ref 31.5–35.7)
MCV: 91 fL (ref 79–97)
Platelets: 378 10*3/uL (ref 150–450)
RBC: 4.62 x10E6/uL (ref 3.77–5.28)
RDW: 12 % (ref 11.7–15.4)
WBC: 5.1 10*3/uL (ref 3.4–10.8)

## 2023-01-31 LAB — LIPID PANEL
Chol/HDL Ratio: 3.2 {ratio} (ref 0.0–4.4)
Cholesterol, Total: 185 mg/dL (ref 100–199)
HDL: 58 mg/dL (ref >39)
LDL Chol Calc (NIH): 110 mg/dL — ABNORMAL HIGH (ref 0–99)
Triglycerides: 91 mg/dL (ref 0–149)
VLDL Cholesterol Cal: 17 mg/dL (ref 5–40)

## 2023-01-31 LAB — HEMOGLOBIN A1C
Est. average glucose Bld gHb Est-mCnc: 108 mg/dL
Hgb A1c MFr Bld: 5.4 % (ref 4.8–5.6)

## 2023-01-31 LAB — TSH: TSH: 1.95 u[IU]/mL (ref 0.450–4.500)

## 2023-02-05 LAB — CYTOLOGY - PAP
Adequacy: ABSENT
Comment: NEGATIVE
Diagnosis: NEGATIVE
High risk HPV: NEGATIVE

## 2023-02-08 ENCOUNTER — Encounter (HOSPITAL_BASED_OUTPATIENT_CLINIC_OR_DEPARTMENT_OTHER): Payer: Self-pay | Admitting: Certified Nurse Midwife

## 2023-04-03 DIAGNOSIS — Z1211 Encounter for screening for malignant neoplasm of colon: Secondary | ICD-10-CM | POA: Diagnosis not present

## 2023-04-03 DIAGNOSIS — Z0000 Encounter for general adult medical examination without abnormal findings: Secondary | ICD-10-CM | POA: Diagnosis not present

## 2023-04-03 DIAGNOSIS — Z8632 Personal history of gestational diabetes: Secondary | ICD-10-CM | POA: Diagnosis not present

## 2023-04-03 DIAGNOSIS — Z13220 Encounter for screening for lipoid disorders: Secondary | ICD-10-CM | POA: Diagnosis not present

## 2023-04-03 DIAGNOSIS — Z1231 Encounter for screening mammogram for malignant neoplasm of breast: Secondary | ICD-10-CM | POA: Diagnosis not present

## 2023-04-03 DIAGNOSIS — Z1212 Encounter for screening for malignant neoplasm of rectum: Secondary | ICD-10-CM | POA: Diagnosis not present

## 2023-06-07 DIAGNOSIS — Z1231 Encounter for screening mammogram for malignant neoplasm of breast: Secondary | ICD-10-CM | POA: Diagnosis not present

## 2023-06-07 DIAGNOSIS — R92323 Mammographic fibroglandular density, bilateral breasts: Secondary | ICD-10-CM | POA: Diagnosis not present

## 2023-07-04 DIAGNOSIS — T50904A Poisoning by unspecified drugs, medicaments and biological substances, undetermined, initial encounter: Secondary | ICD-10-CM | POA: Diagnosis not present

## 2023-07-04 DIAGNOSIS — R002 Palpitations: Secondary | ICD-10-CM | POA: Diagnosis not present

## 2023-07-04 DIAGNOSIS — R450 Nervousness: Secondary | ICD-10-CM | POA: Diagnosis not present

## 2023-07-04 DIAGNOSIS — R0602 Shortness of breath: Secondary | ICD-10-CM | POA: Diagnosis not present

## 2023-07-04 DIAGNOSIS — T40715A Adverse effect of cannabis, initial encounter: Secondary | ICD-10-CM | POA: Diagnosis not present

## 2023-07-04 DIAGNOSIS — F419 Anxiety disorder, unspecified: Secondary | ICD-10-CM | POA: Diagnosis not present

## 2023-07-04 DIAGNOSIS — R457 State of emotional shock and stress, unspecified: Secondary | ICD-10-CM | POA: Diagnosis not present
